# Patient Record
Sex: Female | Born: 1995 | Race: White | Hispanic: No | Marital: Single | Smoking: Former smoker
Health system: Southern US, Community
[De-identification: ages and names within clinical notes are randomized; demographics above are authoritative.]

## PROBLEM LIST (undated history)

## (undated) DIAGNOSIS — F32A Depression, unspecified: Secondary | ICD-10-CM

## (undated) DIAGNOSIS — F640 Transsexualism: Secondary | ICD-10-CM

## (undated) DIAGNOSIS — F4310 Post-traumatic stress disorder, unspecified: Secondary | ICD-10-CM

## (undated) DIAGNOSIS — G43909 Migraine, unspecified, not intractable, without status migrainosus: Secondary | ICD-10-CM

## (undated) DIAGNOSIS — F419 Anxiety disorder, unspecified: Secondary | ICD-10-CM

## (undated) DIAGNOSIS — R569 Unspecified convulsions: Secondary | ICD-10-CM

## (undated) DIAGNOSIS — F329 Major depressive disorder, single episode, unspecified: Secondary | ICD-10-CM

## (undated) DIAGNOSIS — Z789 Other specified health status: Secondary | ICD-10-CM

## (undated) HISTORY — DX: Depression, unspecified: F32.A

## (undated) HISTORY — DX: Anxiety disorder, unspecified: F41.9

## (undated) HISTORY — DX: Major depressive disorder, single episode, unspecified: F32.9

## (undated) HISTORY — PX: WISDOM TOOTH EXTRACTION: SHX21

## (undated) HISTORY — DX: Post-traumatic stress disorder, unspecified: F43.10

## (undated) HISTORY — DX: Migraine, unspecified, not intractable, without status migrainosus: G43.909

---

## 2016-11-13 ENCOUNTER — Encounter (HOSPITAL_COMMUNITY): Payer: Self-pay | Admitting: Emergency Medicine

## 2016-11-13 ENCOUNTER — Ambulatory Visit (HOSPITAL_COMMUNITY)
Admission: EM | Admit: 2016-11-13 | Discharge: 2016-11-13 | Disposition: A | Discharge status: Home / Self Care | Payer: Medicaid Other | Attending: Family Medicine | Admitting: Family Medicine

## 2016-11-13 DIAGNOSIS — Z975 Presence of (intrauterine) contraceptive device: Secondary | ICD-10-CM | POA: Insufficient documentation

## 2016-11-13 DIAGNOSIS — Z3202 Encounter for pregnancy test, result negative: Secondary | ICD-10-CM

## 2016-11-13 DIAGNOSIS — Z79899 Other long term (current) drug therapy: Secondary | ICD-10-CM | POA: Insufficient documentation

## 2016-11-13 DIAGNOSIS — Z87891 Personal history of nicotine dependence: Secondary | ICD-10-CM | POA: Insufficient documentation

## 2016-11-13 DIAGNOSIS — R109 Unspecified abdominal pain: Secondary | ICD-10-CM | POA: Diagnosis not present

## 2016-11-13 DIAGNOSIS — N390 Urinary tract infection, site not specified: Secondary | ICD-10-CM | POA: Diagnosis not present

## 2016-11-13 DIAGNOSIS — R112 Nausea with vomiting, unspecified: Secondary | ICD-10-CM | POA: Diagnosis not present

## 2016-11-13 DIAGNOSIS — R300 Dysuria: Secondary | ICD-10-CM | POA: Diagnosis not present

## 2016-11-13 LAB — POCT PREGNANCY, URINE: Preg Test, Ur: NEGATIVE

## 2016-11-13 LAB — POCT URINALYSIS DIP (DEVICE)
Bilirubin Urine: NEGATIVE
Glucose, UA: NEGATIVE mg/dL
Ketones, ur: NEGATIVE mg/dL
Nitrite: NEGATIVE
Protein, ur: 30 mg/dL — AB
Specific Gravity, Urine: 1.02 (ref 1.005–1.030)
Urobilinogen, UA: 0.2 mg/dL (ref 0.0–1.0)
pH: 7.5 (ref 5.0–8.0)

## 2016-11-13 MED ORDER — KETOROLAC TROMETHAMINE 10 MG PO TABS: 10.0000 mg | ORAL_TABLET | Freq: Four times a day (QID) | ORAL | 0 refills | Status: DC | PRN

## 2016-11-13 MED ORDER — SULFAMETHOXAZOLE-TRIMETHOPRIM 800-160 MG PO TABS: 1.0000 | ORAL_TABLET | Freq: Two times a day (BID) | ORAL | 0 refills | Status: AC

## 2016-11-13 NOTE — ED Triage Notes (Signed)
Pt reports burning with urination, vaginal bleeding and left flank pain since yesterday.  She states she had extreme flank pain this morning.  She took a hot shower and it eased it up some.  Vaginal bleeding started this morning as well, described more as mild spotting.

## 2016-11-13 NOTE — ED Provider Notes (Signed)
[facility]    CSN: [id number] Arrival date & time: 11/13/16  1216     History   Chief Complaint Chief Complaint  Patient presents with  . Urinary Tract Infection    HPI Shelia Walker is a 21 y.o. female.   Pt reports burning with urination, vaginal bleeding and left flank pain since yesterday.  She states she had extreme flank pain this morning.  She took a hot shower and it eased it up some.  Vaginal bleeding started this morning as well, described more as mild spotting.  Patient is undergoing transgender therapy.  Some nausea and vomiting.  Pain in left flank is subsiding .  She has had UTI's in past.  IUD in place      History reviewed. No pertinent past medical history.  There are no active problems to display for this patient.   Past Surgical History:  Procedure Laterality Date  . WISDOM TOOTH EXTRACTION      OB History    No data available       Home Medications    Prior to Admission medications   Medication Sig Start Date End Date Taking? Authorizing Provider  testosterone cypionate (DEPOTESTOTERONE CYPIONATE) 100 MG/ML injection Inject 100 mg into the muscle every 14 (fourteen) days. For IM use only   Yes [provider]  ketorolac (TORADOL) 10 MG tablet Take 1 tablet (10 mg total) by mouth every 6 (six) hours as needed. 11/13/16   Elvina Sidle, MD  sulfamethoxazole-trimethoprim (BACTRIM DS,SEPTRA DS) 800-160 MG tablet Take 1 tablet by mouth 2 (two) times daily. 11/13/16 11/20/16  Elvina Sidle, MD    Family History History reviewed. No pertinent family history.  Social History Social History  Substance Use Topics  . Smoking status: Former Games developer  . Smokeless tobacco: Never Used  . Alcohol use Yes     Allergies   Patient has no known allergies.   Review of Systems Review of Systems  Genitourinary: Positive for flank pain and vaginal bleeding.  All other systems reviewed and are negative.    Physical  Exam Triage Vital Signs ED Triage Vitals [11/13/16 1318]  Enc Vitals Group     BP      Pulse      Resp      Temp      Temp src      SpO2      Weight      Height      Head Circumference      Peak Flow      Pain Score 3     Pain Loc      Pain Edu?      Excl. in GC?    No data found.   Updated Vital Signs There were no vitals taken for this visit.     Physical Exam  Constitutional: [name], place, and time. She appears well-developed and well-nourished.  HENT:  Right Ear: External ear normal.  Left Ear: External ear normal.  Mouth/Throat: Oropharynx is clear and moist.  Eyes: Pupils are equal, round, and reactive to light. Conjunctivae and EOM are normal.  Neck: Normal range of motion. Neck supple.  Pulmonary/Chest: Effort normal.  Abdominal: There is tenderness.  Left lower quadrant tenderness along with some mild suprapubic tenderness.  No guarding or rebound  Musculoskeletal: Normal range of motion.  Neurological: She is alert and oriented to person, place, and time.  Skin: Skin is warm and dry.  Nursing note and  vitals reviewed.    UC Treatments / Results  Labs (all labs ordered are listed, but only abnormal results are displayed) Labs Reviewed  POCT URINALYSIS DIP (DEVICE) - Abnormal; Notable for the following:       Result Value   Hgb urine dipstick SMALL (*)    Protein, ur 30 (*)    Leukocytes, UA TRACE (*)    All other components within normal limits  URINE CULTURE  POCT PREGNANCY, URINE  URINE CYTOLOGY ANCILLARY ONLY    EKG  EKG Interpretation None       Radiology No results found.  Procedures Procedures (including critical care time)  Medications Ordered in UC Medications - No data to display   Initial Impression / Assessment and Plan / UC Course  I have reviewed the triage vital signs and the nursing notes.  Pertinent labs & imaging results that were available during my care of the patient were reviewed by me  and considered in my medical decision making (see chart for details).     Final Clinical Impressions(s) / UC Diagnoses   Final diagnoses:  Lower urinary tract infectious disease    New Prescriptions New Prescriptions   KETOROLAC (TORADOL) 10 MG TABLET    Take 1 tablet (10 mg total) by mouth every 6 (six) hours as needed.   SULFAMETHOXAZOLE-TRIMETHOPRIM (BACTRIM DS,SEPTRA DS) 800-160 MG TABLET    Take 1 tablet by mouth 2 (two) times daily.     Controlled Substance Prescriptions [place] Controlled Substance Registry consulted? Not Applicable   Elvina Sidle, MD 11/13/16 1336

## 2016-11-13 NOTE — Discharge Instructions (Signed)
You appear to have a urinary tract infection.  We are running confirmatory tests at this point.  Antibiotics and pain medicine ordered.

## 2016-11-15 LAB — URINE CULTURE: Culture: >=100000 — AB

## 2016-11-15 LAB — URINE CYTOLOGY ANCILLARY ONLY
Chlamydia: NEGATIVE
Neisseria Gonorrhea: NEGATIVE
Trichomonas: NEGATIVE

## 2017-08-12 ENCOUNTER — Ambulatory Visit (HOSPITAL_COMMUNITY)
Admission: EM | Admit: 2017-08-12 | Discharge: 2017-08-12 | Disposition: A | Discharge status: Home / Self Care | Payer: Medicaid Other | Attending: Family Medicine | Admitting: Family Medicine

## 2017-08-12 ENCOUNTER — Encounter (HOSPITAL_COMMUNITY): Payer: Self-pay | Admitting: Emergency Medicine

## 2017-08-12 DIAGNOSIS — R350 Frequency of micturition: Secondary | ICD-10-CM

## 2017-08-12 DIAGNOSIS — R109 Unspecified abdominal pain: Secondary | ICD-10-CM

## 2017-08-12 DIAGNOSIS — Z87891 Personal history of nicotine dependence: Secondary | ICD-10-CM | POA: Insufficient documentation

## 2017-08-12 DIAGNOSIS — N3001 Acute cystitis with hematuria: Secondary | ICD-10-CM | POA: Insufficient documentation

## 2017-08-12 DIAGNOSIS — R300 Dysuria: Secondary | ICD-10-CM

## 2017-08-12 HISTORY — DX: Transsexualism: F64.0

## 2017-08-12 HISTORY — DX: Other specified health status: Z78.9

## 2017-08-12 LAB — POCT URINALYSIS DIP (DEVICE)
Glucose, UA: 100 mg/dL — AB
Hgb urine dipstick: NEGATIVE
Ketones, ur: 15 mg/dL — AB
Nitrite: POSITIVE — AB
Protein, ur: >=300 mg/dL — AB
Specific Gravity, Urine: 1.015 (ref 1.005–1.030)
Urobilinogen, UA: >=8 mg/dL (ref 0.0–1.0)
pH: 5 (ref 5.0–8.0)

## 2017-08-12 MED ORDER — PHENAZOPYRIDINE HCL 200 MG PO TABS: 200.0000 mg | ORAL_TABLET | Freq: Three times a day (TID) | ORAL | 0 refills | Status: DC

## 2017-08-12 MED ORDER — SULFAMETHOXAZOLE-TRIMETHOPRIM 800-160 MG PO TABS: 1.0000 | ORAL_TABLET | Freq: Two times a day (BID) | ORAL | 0 refills | Status: AC

## 2017-08-12 NOTE — ED Provider Notes (Signed)
[facility]   SUBJECTIVE:  Shelia Walker is a 22 y.o. female, AKA Shelia Walker, complains of flank pain, urinary frequency, urgency and dysuria that began this morning.  Patient denies a precipitating event, recent sexual encounter, excessive caffeine intake, but states missed recent testosterone injection.  Localizes the pain to the right flank.  Pain is intermittent and describes it as sharp.  Has tried OTC UTI medications without relief.  Symptoms are made worse with urination.  Admits to similar symptoms in the past.   Complains of nausea, vomiting x3, and suprapubic discomfort.    Denies fever, chills, abnormal vaginal discharge or bleeding, hematuria.    LMP: No LMP recorded. (Menstrual status: IUD).  ROS: As in HPI.  Past Medical History:  Diagnosis Date  . Transgender    Past Surgical History:  Procedure Laterality Date  . WISDOM TOOTH EXTRACTION     Allergies  Allergen Reactions  . Ginger    No current facility-administered medications on file prior to encounter.    Current Outpatient Medications on File Prior to Encounter  Medication Sig Dispense Refill  . ketorolac (TORADOL) 10 MG tablet Take 1 tablet (10 mg total) by mouth every 6 (six) hours as needed. (Patient not taking: Reported on 08/12/2017) 10 tablet 0  . testosterone cypionate (DEPOTESTOTERONE CYPIONATE) 100 MG/ML injection Inject 100 mg into the muscle every 14 (fourteen) days. For IM use only     Social History   Socioeconomic History  . Marital status: Single    Spouse name: Not on file  . Number of children: Not on file  . Years of education: Not on file  . Highest education level: Not on file  Occupational History  . Not on file  Social Needs  . Financial resource strain: Not on file  . Food insecurity:    Worry: Not on file    Inability: Not on file  . Transportation needs:    Medical: Not on file    Non-medical: Not on file  Tobacco Use  . Smoking status: Former Games developer  . Smokeless  tobacco: Never Used  Substance and Sexual Activity  . Alcohol use: Yes  . Drug use: Yes    Types: Marijuana  . Sexual activity: Not on file  Lifestyle  . Physical activity:    Days per week: Not on file    Minutes per session: Not on file  . Stress: Not on file  Relationships  . Social connections:    Talks on phone: Not on file    Gets together: Not on file    Attends religious service: Not on file    Active member of club or organization: Not on file    Attends meetings of clubs or organizations: Not on file    Relationship status: Not on file  . Intimate partner violence:    Fear of current or ex partner: Not on file    Emotionally abused: Not on file    Physically abused: Not on file    Forced sexual activity: Not on file  Other Topics Concern  . Not on file  Social History Narrative  . Not on file   No family history on file.  OBJECTIVE:  Vitals:   08/12/17 1951  BP: (!) 143/94  Pulse: 92  Resp: 18  Temp: 98.2 F (36.8 C)  SpO2: 100%   General appearance: AOx3 in no acute distress HEENT: NCAT.  Oropharynx clear.  Lungs: clear to auscultation bilaterally without adventitious breath sounds Heart: regular  rate and rhythm.  Radial pulses 2+ symmetrical bilaterally Abdomen: soft; non-distended; mild diffuse tenderness about the lower abdomen; bowel sounds present; no masses or organomegaly; no guarding or rebound tenderness Back: RT sided CVA tenderness Extremities: no edema; symmetrical with no gross deformities Skin: warm and dry Neurologic: Ambulates from chair to exam table without difficulty Psychological: alert and cooperative; normal mood and affect   Results for orders placed or performed during the hospital encounter of 08/12/17 (from the past 24 hour(s))  POCT urinalysis dip (device)     Status: Abnormal   Collection Time: 08/12/17  7:59 PM  Result Value Ref Range   Glucose, UA 100 (A) NEGATIVE mg/dL   Bilirubin Urine MODERATE (A) NEGATIVE    Ketones, ur 15 (A) NEGATIVE mg/dL   Specific Gravity, Urine 1.015 1.005 - 1.030   Hgb urine dipstick NEGATIVE NEGATIVE   pH 5.0 5.0 - 8.0   Protein, ur >=300 (A) NEGATIVE mg/dL   Urobilinogen, UA >=4.0>=8.0 0.0 - 1.0 mg/dL   Nitrite POSITIVE (A) NEGATIVE   Leukocytes, UA LARGE (A) NEGATIVE     ASSESSMENT & PLAN:  1. Flank pain   2. Acute cystitis with hematuria     Meds ordered this encounter  Medications  . phenazopyridine (PYRIDIUM) 200 MG tablet    Sig: Take 1 tablet (200 mg total) by mouth 3 (three) times daily.    Dispense:  6 tablet    Refill:  0    Order Specific Question:   Supervising Provider    Answer:   Isa RankinMURRAY, LAURA WILSON [phone][[id number]]  . sulfamethoxazole-trimethoprim (BACTRIM DS,SEPTRA DS) 800-160 MG tablet    Sig: Take 1 tablet by mouth 2 (two) times daily for 10 days.    Dispense:  20 tablet    Refill:  0    Order Specific Question:   Supervising Provider    Answer:   Isa RankinMURRAY, LAURA WILSON [[id number]]    Urine culture sent.  We will call you with the results.   Push fluids and get plenty of rest.   Take antibiotic as directed and to completion Take pyridium as prescribed and as needed for symptomatic relief Follow up with PCP if symptoms persists Return here or go to ER if you have any new or worsening symptoms such as fever, worsening abdominal pain, nausea/vomiting, flank pain, etc...  Outlined signs and symptoms indicating need for more acute intervention. Patient verbalized understanding. After Visit Summary given.     Rennis HardingWurst, Ivi Griffith, PA-C 08/12/17 2031

## 2017-08-12 NOTE — ED Triage Notes (Signed)
Pt prefers to go by "TJ". Pt c/o R flank pain, states taking medication for bladder cramping.

## 2017-08-12 NOTE — Discharge Instructions (Addendum)
Urine culture sent.  We will call you with the results.   Push fluids and get plenty of rest.   Take antibiotic as directed and to completion Take pyridium as prescribed and as needed for symptomatic relief Follow up with PCP if symptoms persists Return here or go to ER if you have any new or worsening symptoms such as fever, worsening abdominal pain, nausea/vomiting, flank pain, etc... 

## 2017-08-14 LAB — URINE CULTURE

## 2017-10-24 DIAGNOSIS — H5213 Myopia, bilateral: Secondary | ICD-10-CM | POA: Diagnosis not present

## 2017-11-02 DIAGNOSIS — F640 Transsexualism: Secondary | ICD-10-CM | POA: Diagnosis not present

## 2017-11-04 ENCOUNTER — Ambulatory Visit (INDEPENDENT_AMBULATORY_CARE_PROVIDER_SITE_OTHER): Payer: BLUE CROSS/BLUE SHIELD | Admitting: Nurse Practitioner

## 2017-11-04 ENCOUNTER — Encounter: Payer: Self-pay | Admitting: Nurse Practitioner

## 2017-11-04 VITALS — BP 120/86 | HR 78 | Temp 98.1°F | Ht 63.0 in | Wt 163.0 lb

## 2017-11-04 DIAGNOSIS — M791 Myalgia, unspecified site: Secondary | ICD-10-CM | POA: Diagnosis not present

## 2017-11-04 DIAGNOSIS — R768 Other specified abnormal immunological findings in serum: Secondary | ICD-10-CM | POA: Diagnosis not present

## 2017-11-04 DIAGNOSIS — Z789 Other specified health status: Secondary | ICD-10-CM | POA: Insufficient documentation

## 2017-11-04 DIAGNOSIS — R739 Hyperglycemia, unspecified: Secondary | ICD-10-CM | POA: Diagnosis not present

## 2017-11-04 DIAGNOSIS — F332 Major depressive disorder, recurrent severe without psychotic features: Secondary | ICD-10-CM

## 2017-11-04 DIAGNOSIS — F640 Transsexualism: Secondary | ICD-10-CM | POA: Insufficient documentation

## 2017-11-04 LAB — COMPREHENSIVE METABOLIC PANEL
ALT: 48 U/L — ABNORMAL HIGH (ref 0–35)
AST: 28 U/L (ref 0–37)
Albumin: 4.9 g/dL (ref 3.5–5.2)
Alkaline Phosphatase: 72 U/L (ref 39–117)
BUN: 11 mg/dL (ref 6–23)
CO2: 31 mEq/L (ref 19–32)
Calcium: 10 mg/dL (ref 8.4–10.5)
Chloride: 102 mEq/L (ref 96–112)
Creatinine, Ser: 0.91 mg/dL (ref 0.40–1.20)
GFR: 82.49 mL/min (ref >60.00)
Glucose, Bld: 102 mg/dL — ABNORMAL HIGH (ref 70–99)
Potassium: 5 mEq/L (ref 3.5–5.1)
Sodium: 141 mEq/L (ref 135–145)
Total Bilirubin: 0.6 mg/dL (ref 0.2–1.2)
Total Protein: 8.2 g/dL (ref 6.0–8.3)

## 2017-11-04 LAB — CBC
HCT: 48.5 % — ABNORMAL HIGH (ref 36.0–46.0)
Hemoglobin: 16.2 g/dL — ABNORMAL HIGH (ref 12.0–15.0)
MCHC: 33.4 g/dL (ref 30.0–36.0)
MCV: 83.5 fl (ref 78.0–100.0)
Platelets: 396 10*3/uL (ref 150.0–400.0)
RBC: 5.81 Mil/uL — ABNORMAL HIGH (ref 3.87–5.11)
RDW: 13.8 % (ref 11.5–15.5)
WBC: 6.1 10*3/uL (ref 4.0–10.5)

## 2017-11-04 LAB — SEDIMENTATION RATE: Sed Rate: 32 mm/hr — ABNORMAL HIGH (ref 0–20)

## 2017-11-04 LAB — TSH: TSH: 1.8 u[IU]/mL (ref 0.35–4.50)

## 2017-11-04 LAB — HEMOGLOBIN A1C: Hgb A1c MFr Bld: 5.7 % (ref 4.6–6.5)

## 2017-11-04 MED ORDER — FLUOXETINE HCL 20 MG PO TABS: 20.0000 mg | ORAL_TABLET | Freq: Every day | ORAL | 0 refills | Status: DC
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
## 2017-11-04 NOTE — Patient Instructions (Addendum)
Please sign medical release to get records from Dr. Bascom Levels.  positive ANA and mild elevation in sed rate, this means symptoms may be related to an autoimmune disorder. I entered referral to rheumatology. CMP, TSH and hgbA1c are stable. CBC indicates elevated hgb and hct due to current use of testosterone injections. Need to compare these results to previous reports from Dr.Frazier. Waiting for records.  Start prozac.  Major Depressive Disorder, Adult Major depressive disorder (MDD) is a mental health condition. MDD often makes you feel sad, hopeless, or helpless. MDD can also cause symptoms in your body. MDD can affect your:  Work.  School.  Relationships.  Other normal activities.  MDD can range from mild to very bad. It may occur once (single episode MDD). It can also occur many times (recurrent MDD). The main symptoms of MDD often include:  Feeling sad, depressed, or irritable most of the time.  Loss of interest.  MDD symptoms also include:  Sleeping too much or too little.  Eating too much or too little.  A change in your weight.  Feeling tired (fatigue) or having low energy.  Feeling worthless.  Feeling guilty.  Trouble making decisions.  Trouble thinking clearly.  Thoughts of suicide or harming others.  Feeling weak.  Feeling agitated.  Keeping yourself from being around other people (isolation).  Follow these instructions at home: Activity  Do these things as told by your doctor: ? Go back to your normal activities. ? Exercise regularly. ? Spend time outdoors. Alcohol  Talk with your doctor about how alcohol can affect your antidepressant medicines.  Do not drink alcohol. Or, limit how much alcohol you drink. ? This means no more than 1 drink a day for nonpregnant women and 2 drinks a day for men. One drink equals one of these:  12 oz of beer.  5 oz of wine.  1 oz of hard liquor. General instructions  Take over-the-counter and  prescription medicines only as told by your doctor.  Eat a healthy diet.  Get plenty of sleep.  Find activities that you enjoy. Make time to do them.  Think about joining a support group. Your doctor may be able to suggest a group for you.  Keep all follow-up visits as told by your doctor. This is important. Where to find more information:  [employer] on Mental Illness: ? www.nami.org  [place] [employer] of Mental Health: ? http://www.maynard.net/  National Suicide Prevention Lifeline: ? 202-510-5806. This is free, 24-hour help. Contact a doctor if:  Your symptoms get worse.  You have new symptoms. Get help right away if:  You self-harm.  You see, hear, taste, smell, or feel things that are not present (hallucinate). If you ever feel like you may hurt yourself or others, or have thoughts about taking your own life, get help right away. You can go to your nearest emergency department or call:  Your local emergency services (911 in the [place]  A suicide crisis helpline, such as the National Suicide Prevention Lifeline: ? (984)222-3764. This is open 24 hours a day.  This information is not intended to replace advice given to you by your health care provider. Make sure you discuss any questions you have with your health care provider. Document Released: 01/27/2015 Document Revised: 11/02/2015 Document Reviewed: 11/02/2015 Elsevier Interactive Patient Education  2017 [employer].

## 2017-11-04 NOTE — Assessment & Plan Note (Addendum)
Use of testosterone injection since 06/2016, prescribed [facility] with [facility]. OV every 96months per patient. No change in mood with hormone replacement therapy
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
## 2017-11-04 NOTE — Assessment & Plan Note (Signed)
I think this is related to depression. Ordered CBC, TSH, sed rate and ANA to make sure there is no secondary cause.

## 2017-11-06 LAB — ANA: Anti Nuclear Antibody(ANA): POSITIVE — AB

## 2017-11-06 LAB — ANTI-NUCLEAR AB-TITER (ANA TITER): ANA Titer 1: 1:640 {titer} — ABNORMAL HIGH

## 2017-11-07 DIAGNOSIS — R7689 Other specified abnormal immunological findings in serum: Secondary | ICD-10-CM | POA: Insufficient documentation

## 2017-11-07 DIAGNOSIS — R768 Other specified abnormal immunological findings in serum: Secondary | ICD-10-CM | POA: Insufficient documentation

## 2017-11-07 NOTE — Addendum Note (Signed)
Addended by: [name] L on: 11/07/2017 01:17 PM   Modules accepted: Orders

## 2017-11-11 DIAGNOSIS — F641 Gender identity disorder in adolescence and adulthood: Secondary | ICD-10-CM | POA: Diagnosis not present

## 2017-11-11 DIAGNOSIS — F331 Major depressive disorder, recurrent, moderate: Secondary | ICD-10-CM | POA: Diagnosis not present

## 2017-11-11 NOTE — Progress Notes (Addendum)
Office Visit Note  Patient: Shelia Walker             Date of Birth: Oct 17, 1995           MRN: [id number]             PCP: Anne Ng, NP Referring: Anne Ng, NP Visit Date: 11/21/2017 Occupation: [employer] 3rd year  Subjective:  Pain in muscles and fatigue.   History of Present Illness: Shelia Walker is a 22 y.o. transgender female who has been seen in consultation per request of PCP for evaluation of muscle pain and fatigue.  According to Shelia Walker the discomfort is started in February 2019 with increased muscle pain which could be really sharp or dull.  There is no history of joint pain.  No history of swelling or rash.  There is positive history of arthritis in mother and grandmother.  Shelia Walker also experiences insomnia and sleeps about 5 to 6 hours at night.  Activities of Daily Living:  Patient reports morning stiffness for 5-10 minutes.   Patient Reports nocturnal pain.  Difficulty dressing/grooming: Reports Difficulty climbing stairs: Reports Difficulty getting out of chair: Reports Difficulty using hands for taps, buttons, cutlery, and/or writing: Denies  Review of Systems  Constitutional: Positive for fatigue. Negative for night sweats, weight gain and weight loss.  HENT: Positive for mouth dryness. Negative for mouth sores, trouble swallowing, trouble swallowing and nose dryness.   Eyes: Negative for pain, redness, visual disturbance and dryness.  Respiratory: Negative for cough, shortness of breath and difficulty breathing.        With activity   Cardiovascular: Negative for chest pain, palpitations, hypertension, irregular heartbeat and swelling in legs/feet.  Gastrointestinal: Negative for abdominal pain, blood in stool, constipation, diarrhea, nausea and vomiting.  Endocrine: Negative for increased urination.  Genitourinary: Negative for nocturia, pelvic pain and vaginal dryness.  Musculoskeletal: Positive for arthralgias, joint pain, myalgias, morning stiffness,  muscle tenderness and myalgias. Negative for joint swelling and muscle weakness.  Skin: Negative for color change, rash, hair loss, skin tightness, ulcers and sensitivity to sunlight.  Allergic/Immunologic: Negative for susceptible to infections.  Neurological: Negative for dizziness, light-headedness, numbness, headaches, memory loss, night sweats and weakness.  Hematological: Negative for bruising/bleeding tendency and swollen glands.  Psychiatric/Behavioral: Positive for depressed mood and sleep disturbance. Negative for confusion. The patient is nervous/anxious.     PMFS History:  Patient Active Problem List   Diagnosis Date Noted  . Positive ANA (antinuclear antibody) 11/07/2017  . 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 Severe episode of recurrent major depressive disorder, without psychotic features (HCC) 11/04/2017  . Myalgia 11/04/2017    Past Medical History:  Diagnosis Date  . Anxiety   . Depression   . Migraines   . PTSD (post-traumatic stress disorder)   . Transgender     Family History  Problem Relation Age of Onset  . Fibromyalgia Mother   . Cerebral aneurysm Mother   . Hyperlipidemia Mother   . Hypertension Mother   . Stroke Mother   . Depression Mother   . Autoimmune disease Mother   . Depression Father   . Fibromyalgia Maternal Grandmother   . Arthritis Maternal Grandmother   . Diabetes Maternal Grandmother   . Depression Maternal Grandmother   . Depression Sister   . Learning disabilities Sister   . Mental illness Sister   . Healthy Brother    Past Surgical History:  Procedure Laterality Date  . WISDOM TOOTH EXTRACTION  Social History   Social History Narrative  . Not on file    Objective: Vital Signs: BP 126/83 (BP Location: Right Arm, Patient Position: Sitting, Cuff Size: Normal)   Pulse 79   Resp 16   Ht 5\' 3"  (1.6 m)   Wt 161 lb (73 kg)   BMI 28.52 kg/m    Physical Exam  Constitutional: [name], place,  and time. She appears well-developed and well-nourished.  HENT:  Head: Normocephalic and atraumatic.  Eyes: Conjunctivae and EOM are normal.  Neck: Normal range of motion.  Cardiovascular: Normal rate, regular rhythm, normal heart sounds and intact distal pulses.  Pulmonary/Chest: Effort normal and breath sounds normal.  Abdominal: Soft. Bowel sounds are normal.  Lymphadenopathy:    She has no cervical adenopathy.  Neurological: She is alert and oriented to person, place, and time.  Skin: Skin is warm and dry. Capillary refill takes less than 2 seconds.  Psychiatric: She has a normal mood and affect. Her behavior is normal.  Nursing note and vitals reviewed.    Musculoskeletal Exam: C-spine thoracic lumbar spine good range of motion.  Shoulder joints elbow joints wrist joint MCPs PIPs DIPs been good range of motion with no synovitis.  Hip joints knee joints ankles MTPs PIPs were in good range of motion.  Patient has not of discomfort range of motion of bilateral knee joints.  There was tenderness on palpation of her trapezius area and bilateral trochanteric area.  Mild hypermobility and hyperalgesia was noted.  CDAI Exam: CDAI Score: Not documented Patient Global Assessment: Not documented; Provider Global Assessment: Not documented Swollen: Not documented; Tender: Not documented Joint Exam   Not documented   There is currently no information documented on the homunculus. Go to the Rheumatology activity and complete the homunculus joint exam.  Investigation: No additional findings. Component     Latest Ref Rng & Units 11/04/2017  ANA Pattern 1      SPECKLED (A)  ANA Titer 1     titer 1:640 (H)  Sed Rate     0 - 20 mm/hr 32 (H)  Anti Nuclear Antibody(ANA)     NEGATIVE POSITIVE (A)   Imaging: No results found.  Recent Labs: Lab Results  Component Value Date   WBC 6.1 11/04/2017   HGB 16.2 (H) 11/04/2017   PLT 396.0 11/04/2017   NA 141 11/04/2017   K 5.0 11/04/2017   CL  102 11/04/2017   CO2 31 11/04/2017   GLUCOSE 102 (H) 11/04/2017   BUN 11 11/04/2017   CREATININE 0.91 11/04/2017   BILITOT 0.6 11/04/2017   ALKPHOS 72 11/04/2017   AST 28 11/04/2017   ALT 48 (H) 11/04/2017   PROT 8.2 11/04/2017   ALBUMIN 4.9 11/04/2017   CALCIUM 10.0 11/04/2017    Speciality Comments: No specialty comments available.  Procedures:  No procedures performed Allergies: Ginger and Pyridium [phenazopyridine hcl]   Assessment / Plan:     Visit Diagnoses: Positive ANA (antinuclear antibody) - 11/04/17: ANA 1:640 speckled, sed rate 32, TSH 1.80.  Patient has no clinical features of autoimmune disease on examination although ANA titer is significant.  I will obtain AVISE labs today.  Myalgia-patient has been experiencing myalgias.  I will obtain CK today.  Chronic pain of both knees -patient gives history of chronic discomfort in the knee joints.  Plan: XR KNEE 3 VIEW RIGHT, XR KNEE 3 VIEW LEFT, the x-ray of both knees were unremarkable.  I have given  a handout  on knee joint exercises.  Rheumatoid factor, Cyclic citrul peptide antibody, IgG  Primary insomnia-patient has been experiencing chronic insomnia for many years.  Patient has been prescribed medication for insomnia.  Myofascial pain-I suspect that patient has myofascial pain syndrome due to generalized hyperalgesia mild hypermobility and ongoing fatigue and discomfort.  Need for regular exercise good sleep hygiene was discussed.  Female-to-female transgender person  Anxiety and depression-patient states that the depression is better controlled now.  History of posttraumatic stress disorder (PTSD)  Hx of migraines  Other fatigue - Plan: COMPLETE METABOLIC PANEL WITH GFR, Sedimentation rate, CK, Serum protein electrophoresis with reflex, Glucose 6 phosphate dehydrogenase   Orders: Orders Placed This Encounter  Procedures  . XR KNEE 3 VIEW RIGHT  . XR KNEE 3 VIEW LEFT  . COMPLETE METABOLIC PANEL WITH GFR  .  Sedimentation rate  . CK  . Rheumatoid factor  . Cyclic citrul peptide antibody, IgG  . Serum protein electrophoresis with reflex  . Glucose 6 phosphate dehydrogenase   No orders of the defined types were placed in this encounter.   Face-to-face time spent with patient was 50 minutes. Greater than 50% of time was spent in counseling and coordination of care.  Follow-Up Instructions: Return for Positive ANA.   Pollyann SavoyShaili Deveshwar, MD  Note - This record has been created using Animal [employer] software.  Chart creation errors have been sought, but may not always  have been located. Such creation errors do not reflect on  the standard of medical care.

## 2017-11-16 DIAGNOSIS — F640 Transsexualism: Secondary | ICD-10-CM | POA: Diagnosis not present

## 2017-11-18 ENCOUNTER — Ambulatory Visit (INDEPENDENT_AMBULATORY_CARE_PROVIDER_SITE_OTHER): Payer: BLUE CROSS/BLUE SHIELD | Admitting: Nurse Practitioner

## 2017-11-18 ENCOUNTER — Encounter: Payer: Self-pay | Admitting: Nurse Practitioner

## 2017-11-18 VITALS — BP 122/80 | HR 87 | Temp 98.5°F | Ht 63.0 in | Wt 160.8 lb

## 2017-11-18 DIAGNOSIS — Z23 Encounter for immunization: Secondary | ICD-10-CM

## 2017-11-18 DIAGNOSIS — G4700 Insomnia, unspecified: Secondary | ICD-10-CM | POA: Diagnosis not present

## 2017-11-18 DIAGNOSIS — F332 Major depressive disorder, recurrent severe without psychotic features: Secondary | ICD-10-CM | POA: Diagnosis not present

## 2017-11-18 MED ORDER — FLUOXETINE HCL 20 MG PO TABS: 20.0000 mg | ORAL_TABLET | Freq: Every day | ORAL | 1 refills | Status: DC

## 2017-11-18 MED ORDER — TRAZODONE HCL 50 MG PO TABS: 25.0000 mg | ORAL_TABLET | Freq: Every evening | ORAL | 1 refills | Status: DC | PRN

## 2017-11-18 NOTE — Patient Instructions (Signed)
[facility]. [address], [place], [place] [address]  Phone: [phone].  [facility] [address], [place], [place] [address]  Phone: [phone]   Trazodone tablets What is this medicine? TRAZODONE (TRAZ oh done) is used to treat depression. This medicine may be used for other purposes; ask your health care provider or pharmacist if you have questions. COMMON BRAND NAME(S): Desyrel What should I tell my health care provider before I take this medicine? They need to know if you have any of these conditions: -attempted suicide or thinking about it -bipolar disorder -bleeding problems -glaucoma -heart disease, or previous heart attack -irregular heart beat -kidney or liver disease -low levels of sodium in the blood -an unusual or allergic reaction to trazodone, other medicines, foods, dyes or preservatives -pregnant or trying to get pregnant -breast-feeding How should I use this medicine? Take this medicine by mouth with a glass of water. Follow the directions on the prescription label. Take this medicine shortly after a meal or a light snack. Take your medicine at regular intervals. Do not take your medicine more often than directed. Do not stop taking this medicine suddenly except upon the advice of your doctor. Stopping this medicine too quickly may cause serious side effects or your condition may worsen. A special MedGuide will be given to you by the pharmacist with each prescription and refill. Be sure to read this information carefully each time. Talk to your pediatrician regarding the use of this medicine in children. Special care may be needed. Overdosage: If you think you have taken too much of this medicine contact a poison control center or emergency room at once. NOTE: This medicine is only for you. Do not share this medicine with others. What if I miss a dose? If you miss a dose, take it as soon as you can. If  it is almost time for your next dose, take only that dose. Do not take double or extra doses. What may interact with this medicine? Do not take this medicine with any of the following medications: -certain medicines for fungal infections like fluconazole, itraconazole, ketoconazole, posaconazole, voriconazole -cisapride -dofetilide -dronedarone -linezolid -MAOIs like Carbex, Eldepryl, Marplan, Nardil, and Parnate -mesoridazine -methylene blue (injected into a vein) -pimozide -saquinavir -thioridazine -ziprasidone This medicine may also interact with the following medications: -alcohol -antiviral medicines for HIV or AIDS -aspirin and aspirin-like medicines -barbiturates like phenobarbital -certain medicines for blood pressure, heart disease, irregular heart beat -certain medicines for depression, anxiety, or psychotic disturbances -certain medicines for migraine headache like almotriptan, eletriptan, frovatriptan, naratriptan, rizatriptan, sumatriptan, zolmitriptan -certain medicines for seizures like carbamazepine and phenytoin -certain medicines for sleep -certain medicines that treat or prevent blood clots like dalteparin, enoxaparin, warfarin -digoxin -fentanyl -lithium -NSAIDS, medicines for pain and inflammation, like ibuprofen or naproxen -other medicines that prolong the QT interval (cause an abnormal heart rhythm) -rasagiline -supplements like St. John's wort, kava kava, valerian -tramadol -tryptophan This list may not describe all possible interactions. Give your health care provider a list of all the medicines, herbs, non-prescription drugs, or dietary supplements you use. Also tell them if you smoke, drink alcohol, or use illegal drugs. Some items may interact with your medicine. What should I watch for while using this medicine? Tell your doctor if your symptoms do not get better or if they get worse. Visit your doctor or health care professional for regular checks  on your progress. Because it may take several weeks to see the full  effects of this medicine, it is important to continue your treatment as prescribed by your doctor. Patients and their families should watch out for new or worsening thoughts of suicide or depression. Also watch out for sudden changes in feelings such as feeling anxious, agitated, panicky, irritable, hostile, aggressive, impulsive, severely restless, overly excited and hyperactive, or not being able to sleep. If this happens, especially at the beginning of treatment or after a change in dose, call your health care professional. Bonita Quin may get drowsy or dizzy. Do not drive, use machinery, or do anything that needs mental alertness until you know how this medicine affects you. Do not stand or sit up quickly, especially if you are an older patient. This reduces the risk of dizzy or fainting spells. Alcohol may interfere with the effect of this medicine. Avoid alcoholic drinks. This medicine may cause dry eyes and blurred vision. If you wear contact lenses you may feel some discomfort. Lubricating drops may help. See your eye doctor if the problem does not go away or is severe. Your mouth may get dry. Chewing sugarless gum, sucking hard candy and drinking plenty of water may help. Contact your doctor if the problem does not go away or is severe. What side effects may I notice from receiving this medicine? Side effects that you should report to your doctor or health care professional as soon as possible: -allergic reactions like skin rash, itching or hives, swelling of the face, lips, or tongue -elevated mood, decreased need for sleep, racing thoughts, impulsive behavior -confusion -fast, irregular heartbeat -feeling faint or lightheaded, falls -feeling agitated, angry, or irritable -loss of balance or coordination -painful or prolonged erections -restlessness, pacing, inability to keep still -suicidal thoughts or other mood  changes -tremors -trouble sleeping -seizures -unusual bleeding or bruising Side effects that usually do not require medical attention (report to your doctor or health care professional if they continue or are bothersome): -change in sex drive or performance -change in appetite or weight -constipation -headache -muscle aches or pains -nausea This list may not describe all possible side effects. Call your doctor for medical advice about side effects. You may report side effects to FDA at 1-800-FDA-1088. Where should I keep my medicine? Keep out of the reach of children. Store at room temperature between 15 and 30 degrees C (59 to 86 degrees F). Protect from light. Keep container tightly closed. Throw away any unused medicine after the expiration date. NOTE: This sheet is a summary. It may not cover all possible information. If you have questions about this medicine, talk to your doctor, pharmacist, or health care provider.  2018 Elsevier/Gold Standard (2015-07-17 16:57:05)  Insomnia Insomnia is a sleep disorder that makes it difficult to fall asleep or to stay asleep. Insomnia can cause tiredness (fatigue), low energy, difficulty concentrating, mood swings, and poor performance at work or school. There are three different ways to classify insomnia:  Difficulty falling asleep.  Difficulty staying asleep.  Waking up too early in the morning.  Any type of insomnia can be long-term (chronic) or short-term (acute). Both are common. Short-term insomnia usually lasts for three months or less. Chronic insomnia occurs at least three times a week for longer than three months. What are the causes? Insomnia may be caused by another condition, situation, or substance, such as:  Anxiety.  Certain medicines.  Gastroesophageal reflux disease (GERD) or other gastrointestinal conditions.  Asthma or other breathing conditions.  Restless legs syndrome, sleep apnea, or other sleep  disorders.  Chronic pain.  Menopause. This may include hot flashes.  Stroke.  Abuse of alcohol, tobacco, or illegal drugs.  Depression.  Caffeine.  Neurological disorders, such as Alzheimer disease.  An overactive thyroid (hyperthyroidism).  The cause of insomnia may not be known. What increases the risk? Risk factors for insomnia include:  Gender. Women are more commonly affected than men.  Age. Insomnia is more common as you get older.  Stress. This may involve your professional or personal life.  Income. Insomnia is more common in people with lower income.  Lack of exercise.  Irregular work schedule or night shifts.  Traveling between different time zones.  What are the signs or symptoms? If you have insomnia, trouble falling asleep or trouble staying asleep is the main symptom. This may lead to other symptoms, such as:  Feeling fatigued.  Feeling nervous about going to sleep.  Not feeling rested in the morning.  Having trouble concentrating.  Feeling irritable, anxious, or depressed.  How is this treated? Treatment for insomnia depends on the cause. If your insomnia is caused by an underlying condition, treatment will focus on addressing the condition. Treatment may also include:  Medicines to help you sleep.  Counseling or therapy.  Lifestyle adjustments.  Follow these instructions at home:  Take medicines only as directed by your health care provider.  Keep regular sleeping and waking hours. Avoid naps.  Keep a sleep diary to help you and your health care provider figure out what could be causing your insomnia. Include: ? When you sleep. ? When you wake up during the night. ? How well you sleep. ? How rested you feel the next day. ? Any side effects of medicines you are taking. ? What you eat and drink.  Make your bedroom a comfortable place where it is easy to fall asleep: ? Put up shades or special blackout curtains to block light from  outside. ? Use a white noise machine to block noise. ? Keep the temperature cool.  Exercise regularly as directed by your health care provider. Avoid exercising right before bedtime.  Use relaxation techniques to manage stress. Ask your health care provider to suggest some techniques that may work well for you. These may include: ? Breathing exercises. ? Routines to release muscle tension. ? Visualizing peaceful scenes.  Cut back on alcohol, caffeinated beverages, and cigarettes, especially close to bedtime. These can disrupt your sleep.  Do not overeat or eat spicy foods right before bedtime. This can lead to digestive discomfort that can make it hard for you to sleep.  Limit screen use before bedtime. This includes: ? Watching TV. ? Using your smartphone, tablet, and computer.  Stick to a routine. This can help you fall asleep faster. Try to do a quiet activity, brush your teeth, and go to bed at the same time each night.  Get out of bed if you are still awake after 15 minutes of trying to sleep. Keep the lights down, but try reading or doing a quiet activity. When you feel sleepy, go back to bed.  Make sure that you drive carefully. Avoid driving if you feel very sleepy.  Keep all follow-up appointments as directed by your health care provider. This is important. Contact a health care provider if:  You are tired throughout the day or have trouble in your daily routine due to sleepiness.  You continue to have sleep problems or your sleep problems get worse. Get help right away if:  You have serious thoughts  about hurting yourself or someone else. This information is not intended to replace advice given to you by your health care provider. Make sure you discuss any questions you have with your health care provider. Document Released: 02/13/2000 Document Revised: 07/18/2015 Document Reviewed: 11/16/2013 Elsevier Interactive Patient Education  [employer].

## 2017-11-18 NOTE — Progress Notes (Signed)
Subjective:  Patient ID: Shelia PlaterVictoria Kestenbaum, female    DOB: 06/04/1995  Age: 22 y.o. MRN: [id number]  CC: Follow-up (2 wk follow up/ had headache for the first week of start med. insomnia consult--was taking melatonin and hydrozyzine--not helping. tdap)  Insomnia  Primary symptoms: fragmented sleep, no sleep disturbance, no difficulty falling asleep, no somnolence, frequent awakening, no premature morning awakening, no malaise/fatigue, no napping.   The current episode started more than one year. The onset quality is gradual. The problem occurs nightly. The problem is unchanged. The symptoms are aggravated by anxiety, caffeine, pain and SSRI use. How many beverages per day that contain caffeine: 0 - 1.  Types of beverages you drink: coffee and soda. Past treatments include medication (melatonin and vistaril). The treatment provided no relief. Typical bedtime:  8-10 P.M..  How long after going to bed to you fall asleep: 15-30 minutes.   PMH includes: associated symptoms present, no hypertension, depression, family stress or anxiety, no restless leg syndrome, no work related stressors, chronic pain, no apnea.  Prior diagnostic workup includes:  Blood work.   Positive ANA: appointment with rheumatology Monday 11/21/2017.  Depression: Improved mood. No adverse effects with prozac. Resolved headache. No SI or HI. No self mutilation Maintains appts with psychologist weekly. [facility], will like another recommendation.  Insomnia: Related with mood disorder. Can not stay asleep. Wakes up every 1-2hrs. Used melatonin 30mg   In past with no relief Vistaril may is worst.  Reviewed past Medical, Social and Family history today.  Outpatient Medications Prior to Visit  Medication Sig Dispense Refill  . B-D 3CC LUER-LOK SYR 25GX1/2" 25G X 1-1/2" 3 ML MISC INJECT 1 ML Q OTHER WEEK INTO MUSCLE UTD  1  . BD DISP NEEDLES 22G X 1-1/2" MISC USE UTD  1  . testosterone  cypionate (DEPOTESTOSTERONE CYPIONATE) 200 MG/ML injection INJECT 1 ML INTO THE MUSCLE Q 2 WEEKS  0  . FLUoxetine (PROZAC) 20 MG tablet Take 1 tablet (20 mg total) by mouth daily. 30 tablet 0  . phenazopyridine (PYRIDIUM) 200 MG tablet Take 1 tablet (200 mg total) by mouth 3 (three) times daily. (Patient not taking: Reported on 11/04/2017) 6 tablet 0  . testosterone cypionate (DEPOTESTOTERONE CYPIONATE) 100 MG/ML injection Inject 100 mg into the muscle every 14 (fourteen) days. For IM use only     No facility-administered medications prior to visit.     ROS See HPI  Objective:  BP 122/80   Pulse 87   Temp 98.5 F (36.9 C) (Oral)   Ht 5\' 3"  (1.6 m)   Wt 160 lb 12.8 oz (72.9 kg)   SpO2 97%   BMI 28.48 kg/m   BP Readings from Last 3 Encounters:  11/18/17 122/80  11/04/17 120/86  08/12/17 (!) 143/94    Wt Readings from Last 3 Encounters:  11/18/17 160 lb 12.8 oz (72.9 kg)  11/04/17 163 lb (73.9 kg)    Physical Exam  Constitutional: She appears well-developed and well-nourished.  Cardiovascular: Normal rate and regular rhythm.  Psychiatric: She has a normal mood and affect. [name]. Thought content is not delusional. Cognition and memory are normal. She expresses no suicidal ideation. She expresses no suicidal plans and no homicidal plans.  Vitals reviewed.   Lab Results  Component Value Date   WBC 6.1 11/04/2017   HGB 16.2 (H) 11/04/2017   HCT 48.5 (H) 11/04/2017   PLT 396.0 11/04/2017   GLUCOSE  102 (H) 11/04/2017   ALT 48 (H) 11/04/2017   AST 28 11/04/2017   NA 141 11/04/2017   K 5.0 11/04/2017   CL 102 11/04/2017   CREATININE 0.91 11/04/2017   BUN 11 11/04/2017   CO2 31 11/04/2017   TSH 1.80 11/04/2017   HGBA1C 5.7 11/04/2017    Assessment & Plan:   Milo was seen today for follow-up.  Diagnoses and all orders for this visit:  Severe episode of recurrent major depressive disorder, without psychotic features (HCC) -      FLUoxetine (PROZAC) 20 MG tablet; Take 1 tablet (20 mg total) by mouth daily.  Insomnia, unspecified type -     traZODone (DESYREL) 50 MG tablet; Take 0.5-1 tablets (25-50 mg total) by mouth at bedtime as needed for sleep.  Need for diphtheria-tetanus-pertussis (Tdap) vaccine -     Tdap vaccine greater than or equal to 7yo IM   I am having [name] Friedl "TJ" start on traZODone. I am also having her maintain her testosterone cypionate, phenazopyridine, BD DISP NEEDLES, B-D 3CC LUER-LOK SYR 25GX1/2", testosterone cypionate, and FLUoxetine.  Meds ordered this encounter  Medications  . traZODone (DESYREL) 50 MG tablet    Sig: Take 0.5-1 tablets (25-50 mg total) by mouth at bedtime as needed for sleep.    Dispense:  30 tablet    Refill:  1    Order Specific Question:   Supervising Provider    Answer:   MATTHEWS, CODY [[id number]]  . FLUoxetine (PROZAC) 20 MG tablet    Sig: Take 1 tablet (20 mg total) by mouth daily.    Dispense:  90 tablet    Refill:  1    Order Specific Question:   Supervising Provider    Answer:   MATTHEWS, CODY [[id number]]    Follow-up: Return in about 4 weeks (around 12/16/2017) for insomnia and depression.  [name], NP

## 2017-11-21 ENCOUNTER — Encounter: Payer: Self-pay | Admitting: Rheumatology

## 2017-11-21 ENCOUNTER — Ambulatory Visit (INDEPENDENT_AMBULATORY_CARE_PROVIDER_SITE_OTHER): Payer: Self-pay

## 2017-11-21 ENCOUNTER — Encounter: Payer: Self-pay | Admitting: Nurse Practitioner

## 2017-11-21 ENCOUNTER — Ambulatory Visit: Payer: BLUE CROSS/BLUE SHIELD | Admitting: Rheumatology

## 2017-11-21 VITALS — BP 126/83 | HR 79 | Resp 16 | Ht 63.0 in | Wt 161.0 lb

## 2017-11-21 DIAGNOSIS — R768 Other specified abnormal immunological findings in serum: Secondary | ICD-10-CM | POA: Diagnosis not present

## 2017-11-21 DIAGNOSIS — F640 Transsexualism: Secondary | ICD-10-CM

## 2017-11-21 DIAGNOSIS — M7918 Myalgia, other site: Secondary | ICD-10-CM

## 2017-11-21 DIAGNOSIS — F5101 Primary insomnia: Secondary | ICD-10-CM | POA: Diagnosis not present

## 2017-11-21 DIAGNOSIS — Z789 Other specified health status: Secondary | ICD-10-CM

## 2017-11-21 DIAGNOSIS — F32A Depression, unspecified: Secondary | ICD-10-CM

## 2017-11-21 DIAGNOSIS — F419 Anxiety disorder, unspecified: Secondary | ICD-10-CM

## 2017-11-21 DIAGNOSIS — M25561 Pain in right knee: Secondary | ICD-10-CM

## 2017-11-21 DIAGNOSIS — M25562 Pain in left knee: Secondary | ICD-10-CM

## 2017-11-21 DIAGNOSIS — M791 Myalgia, unspecified site: Secondary | ICD-10-CM

## 2017-11-21 DIAGNOSIS — G8929 Other chronic pain: Secondary | ICD-10-CM

## 2017-11-21 DIAGNOSIS — F329 Major depressive disorder, single episode, unspecified: Secondary | ICD-10-CM

## 2017-11-21 DIAGNOSIS — R5383 Other fatigue: Secondary | ICD-10-CM | POA: Diagnosis not present

## 2017-11-21 DIAGNOSIS — Z8669 Personal history of other diseases of the nervous system and sense organs: Secondary | ICD-10-CM

## 2017-11-21 DIAGNOSIS — Z8659 Personal history of other mental and behavioral disorders: Secondary | ICD-10-CM

## 2017-11-21 NOTE — Patient Instructions (Signed)

## 2017-11-22 ENCOUNTER — Other Ambulatory Visit: Payer: Self-pay | Admitting: *Deleted

## 2017-11-22 ENCOUNTER — Telehealth: Payer: Self-pay | Admitting: *Deleted

## 2017-11-22 DIAGNOSIS — M791 Myalgia, unspecified site: Secondary | ICD-10-CM | POA: Diagnosis not present

## 2017-11-22 DIAGNOSIS — R768 Other specified abnormal immunological findings in serum: Secondary | ICD-10-CM

## 2017-11-22 DIAGNOSIS — M7918 Myalgia, other site: Secondary | ICD-10-CM

## 2017-11-22 NOTE — Telephone Encounter (Signed)
-----   Message from Pollyann SavoyShaili Deveshwar, MD sent at 11/22/2017 11:32 AM EDT ----- Please add myositis assessment panel.

## 2017-11-22 NOTE — Progress Notes (Signed)
Please add myositis assessment panel.

## 2017-11-23 LAB — COMPLETE METABOLIC PANEL WITH GFR
AG Ratio: 1.5 (calc) (ref 1.0–2.5)
ALT: 34 U/L — ABNORMAL HIGH (ref 6–29)
AST: 21 U/L (ref 10–30)
Albumin: 4.8 g/dL (ref 3.6–5.1)
Alkaline phosphatase (APISO): 79 U/L (ref 33–115)
BUN: 13 mg/dL (ref 7–25)
CO2: 33 mmol/L — ABNORMAL HIGH (ref 20–32)
Calcium: 9.9 mg/dL (ref 8.6–10.2)
Chloride: 100 mmol/L (ref 98–110)
Creat: 0.88 mg/dL (ref 0.50–1.10)
GFR, Est African American: 109 mL/min/{1.73_m2} (ref >=60)
GFR, Est Non African American: 94 mL/min/{1.73_m2} (ref >=60)
Globulin: 3.1 g/dL (calc) (ref 1.9–3.7)
Glucose, Bld: 88 mg/dL (ref 65–99)
Potassium: 4.1 mmol/L (ref 3.5–5.3)
Sodium: 140 mmol/L (ref 135–146)
Total Bilirubin: 0.4 mg/dL (ref 0.2–1.2)
Total Protein: 7.9 g/dL (ref 6.1–8.1)

## 2017-11-23 LAB — SEDIMENTATION RATE: Sed Rate: 2 mm/h (ref 0–20)

## 2017-11-23 LAB — PROTEIN ELECTROPHORESIS, SERUM, WITH REFLEX
Albumin ELP: 4.8 g/dL (ref 3.8–4.8)
Alpha 1: 0.3 g/dL (ref 0.2–0.3)
Alpha 2: 1 g/dL — ABNORMAL HIGH (ref 0.5–0.9)
Beta 2: 0.4 g/dL (ref 0.2–0.5)
Beta Globulin: 0.6 g/dL (ref 0.4–0.6)
Gamma Globulin: 1.2 g/dL (ref 0.8–1.7)
Total Protein: 8.1 g/dL (ref 6.1–8.1)

## 2017-11-23 LAB — GLUCOSE 6 PHOSPHATE DEHYDROGENASE: G-6PDH: 15.8 U/g Hgb (ref 7.0–20.5)

## 2017-11-23 LAB — CK: Total CK: 536 U/L — ABNORMAL HIGH (ref 29–143)

## 2017-11-23 LAB — RHEUMATOID FACTOR: Rhuematoid fact SerPl-aCnc: <14 IU/mL (ref <14)

## 2017-11-23 LAB — CYCLIC CITRUL PEPTIDE ANTIBODY, IGG: Cyclic Citrullin Peptide Ab: <16 UNITS

## 2017-11-23 NOTE — Progress Notes (Signed)
When patient comes for aldolase and myositis panel please add another CK.

## 2017-11-29 ENCOUNTER — Emergency Department (HOSPITAL_COMMUNITY)
Admission: EM | Admit: 2017-11-29 | Discharge: 2017-11-29 | Disposition: A | Discharge status: Home / Self Care | Payer: BLUE CROSS/BLUE SHIELD | Attending: Emergency Medicine | Admitting: Emergency Medicine

## 2017-11-29 ENCOUNTER — Other Ambulatory Visit: Payer: Self-pay

## 2017-11-29 ENCOUNTER — Encounter (HOSPITAL_COMMUNITY): Payer: Self-pay

## 2017-11-29 ENCOUNTER — Emergency Department (HOSPITAL_COMMUNITY): Payer: BLUE CROSS/BLUE SHIELD

## 2017-11-29 DIAGNOSIS — R9431 Abnormal electrocardiogram [ECG] [EKG]: Secondary | ICD-10-CM | POA: Diagnosis not present

## 2017-11-29 DIAGNOSIS — Z87891 Personal history of nicotine dependence: Secondary | ICD-10-CM | POA: Insufficient documentation

## 2017-11-29 DIAGNOSIS — R404 Transient alteration of awareness: Secondary | ICD-10-CM | POA: Diagnosis not present

## 2017-11-29 DIAGNOSIS — R457 State of emotional shock and stress, unspecified: Secondary | ICD-10-CM | POA: Diagnosis not present

## 2017-11-29 DIAGNOSIS — Z79899 Other long term (current) drug therapy: Secondary | ICD-10-CM | POA: Insufficient documentation

## 2017-11-29 DIAGNOSIS — F419 Anxiety disorder, unspecified: Secondary | ICD-10-CM | POA: Diagnosis present

## 2017-11-29 DIAGNOSIS — R29818 Other symptoms and signs involving the nervous system: Secondary | ICD-10-CM | POA: Diagnosis not present

## 2017-11-29 LAB — URINALYSIS, ROUTINE W REFLEX MICROSCOPIC
Bilirubin Urine: NEGATIVE
Glucose, UA: NEGATIVE mg/dL
Hgb urine dipstick: NEGATIVE
Ketones, ur: NEGATIVE mg/dL
Nitrite: NEGATIVE
Protein, ur: NEGATIVE mg/dL
Specific Gravity, Urine: 1.015 (ref 1.005–1.030)
pH: 9 — ABNORMAL HIGH (ref 5.0–8.0)

## 2017-11-29 LAB — CBC WITH DIFFERENTIAL/PLATELET
Basophils Absolute: 0 10*3/uL (ref 0.0–0.1)
Basophils Relative: 0 %
Eosinophils Absolute: 0.1 10*3/uL (ref 0.0–0.7)
Eosinophils Relative: 1 %
HCT: 50.2 % — ABNORMAL HIGH (ref 36.0–46.0)
Hemoglobin: 16.7 g/dL — ABNORMAL HIGH (ref 12.0–15.0)
Lymphocytes Relative: 18 %
Lymphs Abs: 1.7 10*3/uL (ref 0.7–4.0)
MCH: 28.8 pg (ref 26.0–34.0)
MCHC: 33.3 g/dL (ref 30.0–36.0)
MCV: 86.6 fL (ref 78.0–100.0)
Monocytes Absolute: 0.7 10*3/uL (ref 0.1–1.0)
Monocytes Relative: 7 %
Neutro Abs: 7 10*3/uL (ref 1.7–7.7)
Neutrophils Relative %: 74 %
Platelets: 390 10*3/uL (ref 150–400)
RBC: 5.8 MIL/uL — ABNORMAL HIGH (ref 3.87–5.11)
RDW: 13.6 % (ref 11.5–15.5)
WBC: 9.5 10*3/uL (ref 4.0–10.5)

## 2017-11-29 LAB — COMPREHENSIVE METABOLIC PANEL
ALT: 33 U/L (ref 0–44)
AST: 25 U/L (ref 15–41)
Albumin: 4.7 g/dL (ref 3.5–5.0)
Alkaline Phosphatase: 67 U/L (ref 38–126)
Anion gap: 11 (ref 5–15)
BUN: 11 mg/dL (ref 6–20)
CO2: 27 mmol/L (ref 22–32)
Calcium: 9.6 mg/dL (ref 8.9–10.3)
Chloride: 102 mmol/L (ref 98–111)
Creatinine, Ser: 0.83 mg/dL (ref 0.44–1.00)
GFR calc Af Amer: >60 mL/min (ref >60)
GFR calc non Af Amer: >60 mL/min (ref >60)
Glucose, Bld: 97 mg/dL (ref 70–99)
Potassium: 3.7 mmol/L (ref 3.5–5.1)
Sodium: 140 mmol/L (ref 135–145)
Total Bilirubin: 0.4 mg/dL (ref 0.3–1.2)
Total Protein: 8.4 g/dL — ABNORMAL HIGH (ref 6.5–8.1)

## 2017-11-29 LAB — I-STAT TROPONIN, ED: Troponin i, poc: 0 ng/mL (ref 0.00–0.08)

## 2017-11-29 LAB — CBG MONITORING, ED: Glucose-Capillary: 92 mg/dL (ref 70–99)

## 2017-11-29 LAB — I-STAT BETA HCG BLOOD, ED (MC, WL, AP ONLY): I-stat hCG, quantitative: <5 m[IU]/mL (ref <5)

## 2017-11-29 NOTE — ED Provider Notes (Signed)
[facility]-EMERGENCY DEPT Provider Note  CSN: [id number] Arrival date & time: 11/29/17  1109    History   Chief Complaint Chief Complaint  Patient presents with  . Anxiety    HPI Shelia Walker is a 22 y.o. Transitioning female to female with a psychiatric history of PTSD, depression and anxiety and no significant medical history who presented to the ED for an amalgamation of symptoms. He states that he was in class when he started feeling an anxiety attack precipitating and described hyperventilation and panic. Then he reports decreased vision in his left eye, inability to speak, feeling confused and unable to move which was atypical for him. Part of this episode was witnessed by patient's partner who states that the patient's speech was unintelligible. A classmate called EMS and patient states that symptoms resolved en route to the ED. Denies chest pain, SOB, N/V, diaphoresis or LOC. Denies head trauma, substance or EtOH use.  combination.  Patient reports that family history is significant for stroke and that his mother had a stroke in her 30s and has a brain aneurysm.   Past Medical History:  Diagnosis Date  . Anxiety   . Depression   . Migraines   . PTSD (post-traumatic stress disorder)   . Transgender     Patient Active Problem List   Diagnosis Date Noted  . Positive ANA (antinuclear antibody) 11/07/2017  . 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 Severe episode of recurrent major depressive disorder, without psychotic features (HCC) 11/04/2017  . Myalgia 11/04/2017    Past Surgical History:  Procedure Laterality Date  . WISDOM TOOTH EXTRACTION       OB History   None      Home Medications    Prior to Admission medications   Medication Sig Start Date End Date Taking? Authorizing Provider  FLUoxetine (PROZAC) 20 MG tablet Take 1 tablet (20 mg total) by mouth daily. 11/18/17  Yes Nche, Bonna Gains, NP  naproxen sodium (ALEVE) 220 MG tablet  Take 880 mg by mouth daily as needed (pain).   Yes [provider]  testosterone cypionate (DEPOTESTOSTERONE CYPIONATE) 200 MG/ML injection Inject 200 mg into the muscle every 14 (fourteen) days.  10/21/17  Yes [provider]  B-D 3CC LUER-LOK SYR 25GX1/2" 25G X 1-1/2" 3 ML MISC INJECT 1 ML Q OTHER WEEK INTO MUSCLE UTD 10/21/17   [provider]  BD DISP NEEDLES 22G X 1-1/2" MISC USE UTD 10/21/17   [provider]  phenazopyridine (PYRIDIUM) 200 MG tablet Take 1 tablet (200 mg total) by mouth 3 (three) times daily. Patient not taking: Reported on 11/04/2017 08/12/17   Wurst, [place], PA-C  traZODone (DESYREL) 50 MG tablet Take 0.5-1 tablets (25-50 mg total) by mouth at bedtime as needed for sleep. Patient not taking: Reported on 11/29/2017 11/18/17   Nche, Bonna Gains, NP    Family History Family History  Problem Relation Age of Onset  . Fibromyalgia Mother   . Cerebral aneurysm Mother   . Hyperlipidemia Mother   . Hypertension Mother   . Stroke Mother   . Depression Mother   . Autoimmune disease Mother   . Depression Father   . Fibromyalgia Maternal Grandmother   . Arthritis Maternal Grandmother   . Diabetes Maternal Grandmother   . Depression Maternal Grandmother   . Depression Sister   . Learning disabilities Sister   . Mental illness Sister   . Healthy Brother     Social History Social History  Tobacco Use  . Smoking status: Former Games developer  . Smokeless tobacco: Never Used  Substance Use Topics  . Alcohol use: Yes    Comment: social  . Drug use: Yes    Types: Marijuana    Comment: social     Allergies   Ginger and Pyridium [phenazopyridine hcl]   Review of Systems Review of Systems  Constitutional: Negative.   HENT: Negative.   Eyes: Positive for visual disturbance.  Respiratory: Negative for cough, choking, chest tightness and shortness of breath.   Cardiovascular: Negative for chest pain and palpitations.  Gastrointestinal:  Negative.   Genitourinary: Negative.   Musculoskeletal: Negative.   Skin: Negative.   Neurological: Positive for speech difficulty and numbness. Negative for syncope, facial asymmetry, weakness, light-headedness and headaches.  Psychiatric/Behavioral: Positive for confusion.   Physical Exam Updated Vital Signs BP 129/77   Pulse 86   Temp 98.3 F (36.8 C) (Oral)   Resp 18   Ht 5\' 3"  (1.6 m)   Wt 72.8 kg   SpO2 98%   BMI 28.43 kg/m   Physical Exam  Constitutional: [name], place, and time. Vital signs are normal. She appears well-developed and well-nourished. She is cooperative. No distress.  HENT:  Head: Normocephalic and atraumatic.  Mouth/Throat: Oropharynx is clear and moist and mucous membranes are normal.  Right side uvula deviation.  Eyes: Pupils are equal, round, and reactive to light. Conjunctivae, EOM and lids are normal.  Bilateral pupils reactive to light, but right pupil more delayed than left with reaction. Normal visual acuity.  Neck: Normal range of motion and full passive range of motion without pain. Neck supple. No spinous process tenderness and no muscular tenderness present. Normal range of motion present.  Cardiovascular: Normal rate, regular rhythm and normal heart sounds.  No murmur heard. Pulmonary/Chest: Effort normal and breath sounds normal.  Musculoskeletal: Normal range of motion.  Neurological: She is alert and oriented to person, place, and time. She has normal strength. No cranial nerve deficit or sensory deficit. She exhibits normal muscle tone. Coordination and gait normal. GCS eye subscore is 4. GCS verbal subscore is 5. GCS motor subscore is 6.  Reflex Scores:      Tricep reflexes are 2+ on the right side and 2+ on the left side.      Bicep reflexes are 2+ on the right side and 2+ on the left side.      Brachioradialis reflexes are 2+ on the right side and 2+ on the left side.      Patellar reflexes are 2+ on the right side  and 2+ on the left side.      Achilles reflexes are 2+ on the right side and 2+ on the left side. Skin: Skin is warm and intact. Capillary refill takes less than 2 seconds.  Psychiatric: She has a normal mood and affect. [name]. Thought content normal. Cognition and memory are normal.  Nursing note and vitals reviewed.  ED Treatments / Results  Labs (all labs ordered are listed, but only abnormal results are displayed) Labs Reviewed  CBC WITH DIFFERENTIAL/PLATELET - Abnormal; Notable for the following components:      Result Value   RBC 5.80 (*)    Hemoglobin 16.7 (*)    HCT 50.2 (*)    All other components within normal limits  COMPREHENSIVE METABOLIC PANEL - Abnormal; Notable for the following components:   Total Protein 8.4 (*)  All other components within normal limits  URINALYSIS, ROUTINE W REFLEX MICROSCOPIC - Abnormal; Notable for the following components:   APPearance HAZY (*)    pH 9.0 (*)    Leukocytes, UA SMALL (*)    Bacteria, UA RARE (*)    All other components within normal limits  CBG MONITORING, ED  I-STAT BETA HCG BLOOD, ED ([facility], [facility], [facility] ONLY)  I-STAT TROPONIN, ED    EKG EKG Interpretation  Date/Time:  Tuesday November 29 2017 13:27:12 EDT Ventricular Rate:  80 PR Interval:    QRS Duration: 82 QT Interval:  342 QTC Calculation: 395 R Axis:   65 Text Interpretation:  Sinus rhythm Borderline short PR interval Borderline T wave abnormalities No previous ECGs available Confirmed by Richardean Canal [phone]) on 11/29/2017 1:40:23 PM   Radiology Ct Head Wo Contrast  Result Date: 11/29/2017 CLINICAL DATA:  Subacute neuro deficits EXAM: CT HEAD WITHOUT CONTRAST TECHNIQUE: Contiguous axial images were obtained from the base of the skull through the vertex without intravenous contrast. COMPARISON:  None. FINDINGS: Brain: The ventricular system is normal in size and configuration and the septum is in a normal midline position. The  fourth ventricle and basilar cisterns are unremarkable. No hemorrhage, mass lesion, or acute infarction is seen. Vascular: No vascular abnormality is seen on this unenhanced study. Skull: On bone window images, no calvarial abnormality is seen. Sinuses/Orbits: The paranasal sinuses are well pneumatized Other: None IMPRESSION: .  Negative unenhanced CT of the brain Electronically Signed   By: Dwyane Dee M.D.   On: 11/29/2017 13:54    Procedures Procedures (including critical care time)  Medications Ordered in ED Medications - No data to display   Initial Impression / Assessment and Plan / ED Course  Triage vital signs and the nursing notes have been reviewed.  Pertinent labs & imaging results that were available during care of the patient were reviewed and considered in medical decision making (see chart for details).  Patient presents to the ED following a 15-20 minute episode where he had various neuro symptoms which included vision changes, paresthesias, speech changes and AMS. Preceding symptoms of hyperventilation and panic that patient experienced are consistent with his previous anxiety attacks. Physical exam is grossly unremarkable except for uvula deviation and unilateral delayed pupil reaction There are no other focal neuro deficits on exam. History does not include any head trauma, substance or EtOH use. Family history significant for premature neuro issues which raises concern for an intracranial process. Will proceed with CT head for further evaluation in addition to basic labs.  Clinical Course as of Nov 29 1449  Tue Nov 29, 2017  1411 EKG showed NSR. No ST elevations/depressions or signs of acute ischemia or infarct. This is reassuring in combination with negative troponin which assists in evaluating and ruling out an acute cardiac process. Labs unremarkable. CT head normal. No acute intracranial process such as infarct or hemorrhage seen.   [GM]    Clinical Course User  Index [GM] Shantai Tiedeman, Sharyon Medicus, PA-C    Final Clinical Impressions(s) / ED Diagnoses  1. Altered Awareness. CT head and lab work normal. No clear etiology. Possible seizure activity. Education provided on s/s that would warrant return to the ED vs PCP. Referral to neurology given for follow-up if desired.  Dispo: Home. After thorough clinical evaluation, this patient is determined to be medically stable and can be safely discharged with the previously mentioned treatment and/or outpatient follow-up/referral(s). At this time, there are no other  apparent medical conditions that require further screening, evaluation or treatment.   Final diagnoses:  Altered awareness, transient    ED Discharge Orders    None        Windy Carina, [place] 11/29/17 1451    Charlynne Pander, MD 11/29/17 [phone]

## 2017-11-29 NOTE — ED Triage Notes (Addendum)
Per EMS-Patient c/o of anxiety that occurred in the middle of music class today at [facility]. Staff reported that the patient was holding his breath during the episode.  Patient states in triage that this did not feel like their usual anxiety attacks that they normally have.

## 2017-11-29 NOTE — Discharge Instructions (Addendum)
Your blood work and head CT today were normal. It is difficult to say what occurred today without being present for the event. However, there were no signs of stroke or brain bleeding on imaging which is reassuring.  If you have another episode like this, please follow-up to the emergency department afterward. I have placed information on a neurology group that you can follow-up with below.  Please try to move up your PCP appointment so you can talk with them about today's episode.  I wish you the best of luck with school. Thank you for allowing me to take care of you today!

## 2017-11-30 ENCOUNTER — Telehealth: Payer: Self-pay | Admitting: *Deleted

## 2017-11-30 DIAGNOSIS — F640 Transsexualism: Secondary | ICD-10-CM | POA: Diagnosis not present

## 2017-11-30 NOTE — Telephone Encounter (Signed)
Patient was seen in the emergency room last night for what was thought was to be possible stroke. Patient's CT Scan of the head was normal. No acute intracranial process such as infarct or hemorrhage seen. The ED Doctor states possible seizure activity. Recommendations were to follow up with PCP and neurology . Patient states was also told to follow up with [place]. Patient has a follow up appointment scheduled for 12/19/17. Would you like for [place] to schedule patient to be seen sooner.

## 2017-11-30 NOTE — Telephone Encounter (Signed)
Per Dr. Corliss Skains aptietn does not need a sooner appointment in our office. The recommendation to see Neurology is the right recommendation. Patient advised. Patient advised that we were going to be referring patient to neurology as  muscle enzymes were elevated and patient would need a EMG and possible muscle biopsy. Patient advised to have them only use one leg to do the EMG per Dr. Corliss Skains as we may need to use the other leg for a muscle biopsy. Patient verbalized understanding.

## 2017-12-01 ENCOUNTER — Ambulatory Visit: Payer: BLUE CROSS/BLUE SHIELD | Admitting: Neurology

## 2017-12-01 ENCOUNTER — Encounter: Payer: Self-pay | Admitting: Neurology

## 2017-12-01 VITALS — BP 126/82 | HR 96 | Ht 63.0 in | Wt 159.0 lb

## 2017-12-01 DIAGNOSIS — M791 Myalgia, unspecified site: Secondary | ICD-10-CM

## 2017-12-01 DIAGNOSIS — R404 Transient alteration of awareness: Secondary | ICD-10-CM | POA: Diagnosis not present

## 2017-12-01 DIAGNOSIS — Z8249 Family history of ischemic heart disease and other diseases of the circulatory system: Secondary | ICD-10-CM

## 2017-12-01 NOTE — Progress Notes (Signed)
NEUROLOGY CONSULTATION NOTE  Shelia Walker MRN: [id number] DOB: [birth year]  Referring provider: Chaney Malling, MD (ED referral) Primary care provider: Anne Ng, NP  Reason for consult:  Altered mental status  HISTORY OF PRESENT ILLNESS: Shelia Walker is a 22 year old right-handed transgender female with depression, anxiety, PTSD and migraines who presents for altered mental status.  History supplemented by ED note.  She presented to the ED on 11/29/17.  He was in class when he suddenly started feeling his habitual panic attack, he had trouble breathing, he was shaky in the hands.  He then developed decreased vision in the left eye, he had difficulty forming words, pain in back and calves,  unable to move and feeling confused (trouble remembering conversations).  Episode was witnessed by his partner.  EMS was called.  He did not lose consciousness.  No associated tongue biting or urinary or bowel incontinence.  Symptoms lasted about 30 minutes and resolved while in the ambulance.  He has never had a similar event.  CT of head was personally reviewed and was unremarkable.  There were no electrolyte abnormalities.  UA did reveal small leukocytes and rare bacteria.  Troponin was negative.  As per ED note, exam was noted for unilateral sluggish pupil and uvula deviation.  Otherwise, exam unremarkable.  Possible seizure was suggested.  Since then, he still feels shaky.  He is having more difficulty holding a pen.  In class, he had trouble with recalling information from class.  He is also currently being evaluated by rheumatology for myalgias, arthralgias and fatigue since February 2019.  He had a positive ANA 1:640 speckled, sed rate 32, TSH 1.80, negative RF.  CK from 11/21/17 was 536 and aldolase 10.4.  A Myositis panel is currently in process.  NCV-EMG is requested by rheumatology and they plan on ordering a muscle biopsy.  Family history noted that his mother has had 3 strokes.  The first  stroke was at age 70.  She has had 2 other strokes, the last one in 2015.  His mother also has a cerebral aneurysm.  He has no other family history of aneurysms.  PAST MEDICAL HISTORY: Past Medical History:  Diagnosis Date  . Anxiety   . Depression   . Migraines   . PTSD (post-traumatic stress disorder)   . Transgender     PAST SURGICAL HISTORY: Past Surgical History:  Procedure Laterality Date  . WISDOM TOOTH EXTRACTION      MEDICATIONS: Current Outpatient Medications on File Prior to Visit  Medication Sig Dispense Refill  . B-D 3CC LUER-LOK SYR 25GX1/2" 25G X 1-1/2" 3 ML MISC INJECT 1 ML Q OTHER WEEK INTO MUSCLE UTD  1  . BD DISP NEEDLES 22G X 1-1/2" MISC USE UTD  1  . FLUoxetine (PROZAC) 20 MG tablet Take 1 tablet (20 mg total) by mouth daily. 90 tablet 1  . naproxen sodium (ALEVE) 220 MG tablet Take 880 mg by mouth daily as needed (pain).    . phenazopyridine (PYRIDIUM) 200 MG tablet Take 1 tablet (200 mg total) by mouth 3 (three) times daily. (Patient not taking: Reported on 11/04/2017) 6 tablet 0  . testosterone cypionate (DEPOTESTOSTERONE CYPIONATE) 200 MG/ML injection Inject 200 mg into the muscle every 14 (fourteen) days.   0  . traZODone (DESYREL) 50 MG tablet Take 0.5-1 tablets (25-50 mg total) by mouth at bedtime as needed for sleep. (Patient not taking: Reported on 11/29/2017) 30 tablet 1   No current facility-administered medications on  file prior to visit.     ALLERGIES: Allergies  Allergen Reactions  . Ginger Anaphylaxis  . Pyridium [Phenazopyridine Hcl] Nausea And Vomiting    headache    FAMILY HISTORY: Family History  Problem Relation Age of Onset  . Fibromyalgia Mother   . Cerebral aneurysm Mother   . Hyperlipidemia Mother   . Hypertension Mother   . Stroke Mother   . Depression Mother   . Autoimmune disease Mother   . Depression Father   . Fibromyalgia Maternal Grandmother   . Arthritis Maternal Grandmother   . Diabetes Maternal Grandmother   .  Depression Maternal Grandmother   . Depression Sister   . Learning disabilities Sister   . Mental illness Sister   . Healthy Brother     SOCIAL HISTORY: Social History   Socioeconomic History  . Marital status: Single    Spouse name: Not on file  . Number of children: Not on file  . Years of education: Not on file  . Highest education level: Not on file  Occupational History  . Not on file  Social Needs  . Financial resource strain: Not on file  . Food insecurity:    Worry: Not on file    Inability: Not on file  . Transportation needs:    Medical: Not on file    Non-medical: Not on file  Tobacco Use  . Smoking status: Former Games developer  . Smokeless tobacco: Never Used  Substance and Sexual Activity  . Alcohol use: Yes    Comment: social  . Drug use: Yes    Types: Marijuana    Comment: social  . Sexual activity: Not on file  Lifestyle  . Physical activity:    Days per week: Not on file    Minutes per session: Not on file  . Stress: Not on file  Relationships  . Social connections:    Talks on phone: Not on file    Gets together: Not on file    Attends religious service: Not on file    Active member of club or organization: Not on file    Attends meetings of clubs or organizations: Not on file    Relationship status: Not on file  . Intimate partner violence:    Fear of current or ex partner: Not on file    Emotionally abused: Not on file    Physically abused: Not on file    Forced sexual activity: Not on file  Other Topics Concern  . Not on file  Social History Narrative  . Not on file    REVIEW OF SYSTEMS: Constitutional: No fevers, chills, or sweats, no generalized fatigue, change in appetite Eyes: No visual changes, double vision, eye pain Ear, nose and throat: No hearing loss, ear pain, nasal congestion, sore throat Cardiovascular: No chest pain, palpitations Respiratory:  No shortness of breath at rest or with exertion, wheezes GastrointestinaI: No  nausea, vomiting, diarrhea, abdominal pain, fecal incontinence Genitourinary:  No dysuria, urinary retention or frequency Musculoskeletal:  No neck pain, back pain Integumentary: No rash, pruritus, skin lesions Neurological: as above Psychiatric: No depression, insomnia, anxiety Endocrine: No palpitations, fatigue, diaphoresis, mood swings, change in appetite, change in weight, increased thirst Hematologic/Lymphatic:  No purpura, petechiae. Allergic/Immunologic: no itchy/runny eyes, nasal congestion, recent allergic reactions, rashes  PHYSICAL EXAM: Blood pressure 126/82, pulse 96, height 5\' 3"  (1.6 m), weight 159 lb (72.1 kg), SpO2 98 %. General: No acute distress.  Patient appears well-groomed.   Head:  Normocephalic/atraumatic Eyes:  fundi examined but not visualized Neck: supple, no paraspinal tenderness, full range of motion Back: No paraspinal tenderness Heart: regular rate and rhythm Lungs: Clear to auscultation bilaterally. Vascular: No carotid bruits. Neurological Exam: Mental status: alert and oriented to person, place, and time, recent and remote memory intact, fund of knowledge intact, attention and concentration intact, speech fluent and not dysarthric, language intact. Cranial nerves: CN I: not tested CN II: pupils equal, round and reactive to light, visual fields intact CN III, IV, VI:  full range of motion, no nystagmus, no ptosis CN V: facial sensation intact CN VII: upper and lower face symmetric CN VIII: hearing intact CN IX, X: gag intact, uvula midline CN XI: sternocleidomastoid and trapezius muscles intact CN XII: tongue midline Bulk & Tone: normal, no fasciculations. Motor:  5/5 throughout  Sensation: temperature and vibration sensation intact.  . Deep Tendon Reflexes:  2+ throughout, toes downgoing.   Finger to nose testing:  Without dysmetria.   Heel to shin:  Without dysmetria.   Gait:  Normal station and stride.  Able to turn and tandem walk. Romberg  negative.  IMPRESSION: 1.  Transient altered awareness.  I suspect anxiety attack.  Seizure is possible but less likely.  There is a family history of stroke at young age, but symptoms are not consistent with stroke. 2.  Myalgias with elevated CK and aldolase 3.  Family history of cerebral aneurysm  PLAN: 1.  We will get MRI and MRA of head. 2.  We will get a sleep-deprived EEG 3.  At rheumatology's request, we will order NCV-EMG.  Myositis panel pending.  May need muscle biopsy 4.  Follow up after testing  Thank you for allowing me to take part in the care of this patient.  Shon Millet, DO  CC:  [name], NP  Melvenia Needles, MD (rheumatology)

## 2017-12-01 NOTE — Patient Instructions (Addendum)
1.  We will check MRI of brain  And MRA of head. 2.  We will check a sleep-deprived EEG 3.  We will check NCV-EMG to evaluate for myopathy 4.  Follow up after testing  We have sent a referral to [facility] for your MRI/MRA and they will call you directly to schedule your appt. They are located at [address] [place]. If you need to contact them directly please call [phone].

## 2017-12-02 ENCOUNTER — Ambulatory Visit (INDEPENDENT_AMBULATORY_CARE_PROVIDER_SITE_OTHER): Payer: BLUE CROSS/BLUE SHIELD | Admitting: Nurse Practitioner

## 2017-12-02 ENCOUNTER — Encounter: Payer: Self-pay | Admitting: Nurse Practitioner

## 2017-12-02 VITALS — BP 114/72 | HR 91 | Temp 98.1°F | Ht 63.0 in | Wt 159.8 lb

## 2017-12-02 DIAGNOSIS — F332 Major depressive disorder, recurrent severe without psychotic features: Secondary | ICD-10-CM | POA: Diagnosis not present

## 2017-12-02 DIAGNOSIS — G4700 Insomnia, unspecified: Secondary | ICD-10-CM | POA: Diagnosis not present

## 2017-12-02 DIAGNOSIS — R404 Transient alteration of awareness: Secondary | ICD-10-CM

## 2017-12-02 NOTE — Progress Notes (Signed)
Subjective:  Patient ID: Shelia Walker, female    DOB: 1996-01-31  Age: 22 y.o. MRN: [id number]  CC: Hospitalization Follow-up (ED follow up/still shaky,trouble processing thought at times/ saw neurology yesterday--will be doing alot of test. )  HPI  Shelia Walker is here for hospital follow up. He was evaluated in hospital and by neurology after an episode of altered awareness and blurry vision while in class. His vision is now now, but has persistent difficulty focusing in class or studying. Reports difficulty comprehending school material he did not have difficulty with in past.  Insomnia: He was not able to tolerate trazodone. Caused residual drowsiness.  Depression: Mood is stable with prozac.  Reviewed past Medical, Social and Family history today.  Outpatient Medications Prior to Visit  Medication Sig Dispense Refill  . B-D 3CC LUER-LOK SYR 25GX1/2" 25G X 1-1/2" 3 ML MISC INJECT 1 ML Q OTHER WEEK INTO MUSCLE UTD  1  . BD DISP NEEDLES 22G X 1-1/2" MISC USE UTD  1  . FLUoxetine (PROZAC) 20 MG tablet Take 1 tablet (20 mg total) by mouth daily. 90 tablet 1  . naproxen sodium (ALEVE) 220 MG tablet Take 880 mg by mouth daily as needed (pain).    Marland Kitchen testosterone cypionate (DEPOTESTOSTERONE CYPIONATE) 200 MG/ML injection Inject 200 mg into the muscle every 14 (fourteen) days.   0  . phenazopyridine (PYRIDIUM) 200 MG tablet Take 1 tablet (200 mg total) by mouth 3 (three) times daily. (Patient not taking: Reported on 11/04/2017) 6 tablet 0  . traZODone (DESYREL) 50 MG tablet Take 0.5-1 tablets (25-50 mg total) by mouth at bedtime as needed for sleep. (Patient not taking: Reported on 11/29/2017) 30 tablet 1   No facility-administered medications prior to visit.     ROS See HPI  Objective:  BP 114/72   Pulse 91   Temp 98.1 F (36.7 C) (Oral)   Ht 5\' 3"  (1.6 m)   Wt 159 lb 12.8 oz (72.5 kg)   SpO2 98%   BMI 28.31 kg/m   BP Readings from Last 3 Encounters:  12/02/17 114/72  12/01/17  126/82  11/29/17 129/80    Wt Readings from Last 3 Encounters:  12/02/17 159 lb 12.8 oz (72.5 kg)  12/01/17 159 lb (72.1 kg)  11/29/17 160 lb 8 oz (72.8 kg)    Physical Exam  Constitutional: [name], place, and time.  Eyes: Pupils are equal, round, and reactive to light. Conjunctivae and EOM are normal. No scleral icterus.  Neck: Normal range of motion. Neck supple. No thyromegaly present.  Cardiovascular: Normal rate, regular rhythm, normal heart sounds and intact distal pulses.  Pulmonary/Chest: Effort normal.  Neurological: She is alert and oriented to person, place, and time.  Psychiatric: She has a normal mood and affect. Her behavior is normal. Thought content normal.  Vitals reviewed.   Lab Results  Component Value Date   WBC 9.5 11/29/2017   HGB 16.7 (H) 11/29/2017   HCT 50.2 (H) 11/29/2017   PLT 390 11/29/2017   GLUCOSE 97 11/29/2017   ALT 33 11/29/2017   AST 25 11/29/2017   NA 140 11/29/2017   K 3.7 11/29/2017   CL 102 11/29/2017   CREATININE 0.83 11/29/2017   BUN 11 11/29/2017   CO2 27 11/29/2017   TSH 1.80 11/04/2017   HGBA1C 5.7 11/04/2017    Ct Head Wo Contrast  Result Date: 11/29/2017 CLINICAL DATA:  Subacute neuro deficits EXAM: CT HEAD WITHOUT CONTRAST TECHNIQUE: Contiguous axial images  were obtained from the base of the skull through the vertex without intravenous contrast. COMPARISON:  None. FINDINGS: Brain: The ventricular system is normal in size and configuration and the septum is in a normal midline position. The fourth ventricle and basilar cisterns are unremarkable. No hemorrhage, mass lesion, or acute infarction is seen. Vascular: No vascular abnormality is seen on this unenhanced study. Skull: On bone window images, no calvarial abnormality is seen. Sinuses/Orbits: The paranasal sinuses are well pneumatized Other: None IMPRESSION: .  Negative unenhanced CT of the brain Electronically Signed   By: Dwyane Dee M.D.   On: 11/29/2017  13:54    Assessment & Plan:   Abrie was seen today for hospitalization follow-up.  Diagnoses and all orders for this visit:  Severe episode of recurrent major depressive disorder, without psychotic features (HCC)  Transient alteration of awareness  Insomnia, unspecified type   I have discontinued Shelia Walker "Shelia Walker"'s traZODone. I am also having her maintain her phenazopyridine, BD DISP NEEDLES, B-D 3CC LUER-LOK SYR 25GX1/2", testosterone cypionate, FLUoxetine, and naproxen sodium.  No orders of the defined types were placed in this encounter.   Follow-up: Return in about 3 months (around 03/04/2018) for depression and insomnia.  [name], NP

## 2017-12-02 NOTE — Patient Instructions (Addendum)
We will hold on prescribing another sleep aid at this time, till work up with neurology is completed.  Maintain appt with neurology and rheumatology.  Reschedule appt with me for 3months.  Let me know if any school accommodation paperwork needs to be completed.

## 2017-12-03 ENCOUNTER — Encounter (HOSPITAL_COMMUNITY): Payer: Self-pay

## 2017-12-03 ENCOUNTER — Other Ambulatory Visit: Payer: Self-pay

## 2017-12-03 ENCOUNTER — Emergency Department (HOSPITAL_COMMUNITY): Payer: BLUE CROSS/BLUE SHIELD

## 2017-12-03 ENCOUNTER — Emergency Department (HOSPITAL_COMMUNITY)
Admission: EM | Admit: 2017-12-03 | Discharge: 2017-12-03 | Disposition: A | Discharge status: Home / Self Care | Payer: BLUE CROSS/BLUE SHIELD | Attending: Emergency Medicine | Admitting: Emergency Medicine

## 2017-12-03 DIAGNOSIS — H538 Other visual disturbances: Secondary | ICD-10-CM | POA: Diagnosis not present

## 2017-12-03 DIAGNOSIS — R519 Headache, unspecified: Secondary | ICD-10-CM

## 2017-12-03 DIAGNOSIS — R718 Other abnormality of red blood cells: Secondary | ICD-10-CM | POA: Insufficient documentation

## 2017-12-03 DIAGNOSIS — T387X5A Adverse effect of androgens and anabolic congeners, initial encounter: Secondary | ICD-10-CM | POA: Diagnosis not present

## 2017-12-03 DIAGNOSIS — Z79899 Other long term (current) drug therapy: Secondary | ICD-10-CM | POA: Insufficient documentation

## 2017-12-03 DIAGNOSIS — R251 Tremor, unspecified: Secondary | ICD-10-CM | POA: Diagnosis not present

## 2017-12-03 DIAGNOSIS — R4789 Other speech disturbances: Secondary | ICD-10-CM | POA: Insufficient documentation

## 2017-12-03 DIAGNOSIS — Z87891 Personal history of nicotine dependence: Secondary | ICD-10-CM | POA: Insufficient documentation

## 2017-12-03 DIAGNOSIS — R569 Unspecified convulsions: Secondary | ICD-10-CM | POA: Diagnosis present

## 2017-12-03 DIAGNOSIS — R51 Headache: Secondary | ICD-10-CM | POA: Insufficient documentation

## 2017-12-03 DIAGNOSIS — R0602 Shortness of breath: Secondary | ICD-10-CM | POA: Diagnosis not present

## 2017-12-03 DIAGNOSIS — H53122 Transient visual loss, left eye: Secondary | ICD-10-CM | POA: Diagnosis not present

## 2017-12-03 LAB — URINALYSIS, ROUTINE W REFLEX MICROSCOPIC
Bilirubin Urine: NEGATIVE
Glucose, UA: NEGATIVE mg/dL
Hgb urine dipstick: NEGATIVE
Ketones, ur: NEGATIVE mg/dL
Nitrite: NEGATIVE
Protein, ur: 30 mg/dL — AB
Specific Gravity, Urine: 1.023 (ref 1.005–1.030)
WBC, UA: >50 WBC/hpf — ABNORMAL HIGH (ref 0–5)
pH: 7 (ref 5.0–8.0)

## 2017-12-03 LAB — COMPREHENSIVE METABOLIC PANEL
ALT: 43 U/L (ref 0–44)
AST: 28 U/L (ref 15–41)
Albumin: 4.9 g/dL (ref 3.5–5.0)
Alkaline Phosphatase: 73 U/L (ref 38–126)
Anion gap: 11 (ref 5–15)
BUN: 14 mg/dL (ref 6–20)
CO2: 28 mmol/L (ref 22–32)
Calcium: 9.8 mg/dL (ref 8.9–10.3)
Chloride: 103 mmol/L (ref 98–111)
Creatinine, Ser: 0.86 mg/dL (ref 0.44–1.00)
GFR calc Af Amer: >60 mL/min (ref >60)
GFR calc non Af Amer: >60 mL/min (ref >60)
Glucose, Bld: 90 mg/dL (ref 70–99)
Potassium: 4 mmol/L (ref 3.5–5.1)
Sodium: 142 mmol/L (ref 135–145)
Total Bilirubin: 0.8 mg/dL (ref 0.3–1.2)
Total Protein: 8.9 g/dL — ABNORMAL HIGH (ref 6.5–8.1)

## 2017-12-03 LAB — CBC WITH DIFFERENTIAL/PLATELET
Basophils Absolute: 0 10*3/uL (ref 0.0–0.1)
Basophils Relative: 0 %
Eosinophils Absolute: 0.1 10*3/uL (ref 0.0–0.7)
Eosinophils Relative: 1 %
HCT: 49.6 % — ABNORMAL HIGH (ref 36.0–46.0)
Hemoglobin: 16.6 g/dL — ABNORMAL HIGH (ref 12.0–15.0)
Lymphocytes Relative: 21 %
Lymphs Abs: 2.3 10*3/uL (ref 0.7–4.0)
MCH: 28.9 pg (ref 26.0–34.0)
MCHC: 33.5 g/dL (ref 30.0–36.0)
MCV: 86.3 fL (ref 78.0–100.0)
Monocytes Absolute: 0.9 10*3/uL (ref 0.1–1.0)
Monocytes Relative: 7 %
Neutro Abs: 8.1 10*3/uL — ABNORMAL HIGH (ref 1.7–7.7)
Neutrophils Relative %: 71 %
Platelets: 405 10*3/uL — ABNORMAL HIGH (ref 150–400)
RBC: 5.75 MIL/uL — ABNORMAL HIGH (ref 3.87–5.11)
RDW: 13.7 % (ref 11.5–15.5)
WBC: 11.4 10*3/uL — ABNORMAL HIGH (ref 4.0–10.5)

## 2017-12-03 LAB — MAGNESIUM: Magnesium: 2.1 mg/dL (ref 1.7–2.4)

## 2017-12-03 MED ORDER — SODIUM CHLORIDE 0.9 % IV BOLUS
1000.0000 mL | Freq: Once | INTRAVENOUS | Status: AC
  Administered 2017-12-03: 1000 mL via INTRAVENOUS

## 2017-12-03 MED ORDER — ACETAMINOPHEN 500 MG PO TABS
1000.0000 mg | ORAL_TABLET | Freq: Once | ORAL | Status: AC
  Administered 2017-12-03: 1000 mg via ORAL
  Filled 2017-12-03: qty 2

## 2017-12-03 MED ORDER — METOCLOPRAMIDE HCL 5 MG/ML IJ SOLN
10.0000 mg | Freq: Once | INTRAMUSCULAR | Status: AC
  Administered 2017-12-03: 10 mg via INTRAVENOUS
  Filled 2017-12-03: qty 2

## 2017-12-03 MED ORDER — DIAZEPAM 2 MG PO TABS
2.0000 mg | ORAL_TABLET | Freq: Once | ORAL | Status: AC
  Administered 2017-12-03: 2 mg via ORAL
  Filled 2017-12-03: qty 1

## 2017-12-03 MED ORDER — SODIUM CHLORIDE 0.9 % IV SOLN: INTRAVENOUS | Status: DC

## 2017-12-03 MED ORDER — DIPHENHYDRAMINE HCL 50 MG/ML IJ SOLN
25.0000 mg | Freq: Once | INTRAMUSCULAR | Status: AC
  Administered 2017-12-03: 25 mg via INTRAVENOUS
  Filled 2017-12-03: qty 1

## 2017-12-03 NOTE — ED Notes (Signed)
Pt to MRI

## 2017-12-03 NOTE — ED Notes (Signed)
Called [place] neuro.

## 2017-12-03 NOTE — Discharge Instructions (Signed)
Please see the information and instructions below regarding your visit.  Your diagnoses today include:  1. Tremor of both hands   2. Frontal headache   3. Elevated hematocrit     Tests performed today include: See side panel of your discharge paperwork for testing performed today. Vital signs are listed at the bottom of these instructions.   Fortunately, your workup today including labs are within normal limits.  You do have elevation in a number called a hematocrit on your complete blood count.  This is consistent over time, and consistent with a history of taking testosterone.  This is a side effect of testosterone.  Medications prescribed:    Take any prescribed medications only as prescribed, and any over the counter medications only as directed on the packaging.  Home care instructions:  Please follow any educational materials contained in this packet.   It is very important that you change your lifestyle in order to protect yourself and others from harm in the event that you have a seizure.  This includes:  No driving for 6 months after your most recent episode, and you must be cleared by a neurologist before resuming driving after this interval.  If you work in a job where you operate heavy machinery (e.g. Fork lifts), or any duty such that a sudden loss of consciousness could lead serious harm to yourself or others, you must cease that activity until cleared by a neurologist. Please ask your healthcare provider to provide any documentation necessary for your employer. Do not swim unsupervised or be around any body of water unsupervised (including bathtubs).  Do not climb ladders. Do not stay locked in enclosed spaces where no one could access you if you needed help. This includes bedrooms and bathrooms.  Assess all activity for safety before participating if a sudden loss of consciousness could lead to harm to self or others.  Follow-up instructions:  Please follow up with  your neurologist or establish care with the neurologist listed on your paperwork.   Return instructions:  Please return to the Emergency Department if you experience worsening symptoms.  Return to the emergency department if you have any further abnormal episodes of transient a develop any weakness/numbness of any arm/leg, confusion, slurred speech, or sudden/severe headache. Please return if you have any other emergent concerns.  Additional Information:   Your vital signs today were: BP (!) 100/50    Pulse 75    Temp 98.1 F (36.7 C) (Oral)    Resp 16    Ht 5\' 3"  (1.6 m)    Wt 72 kg    SpO2 96%    BMI 28.12 kg/m  If your blood pressure (BP) was elevated on multiple readings during this visit above 130 for the top number or above 80 for the bottom number, please have this repeated by your primary care provider within one month. --------------  Thank you for allowing [place] to participate in your care today.

## 2017-12-03 NOTE — ED Provider Notes (Signed)
[facility]-EMERGENCY DEPT Provider Note   CSN: [id number] Arrival date & time: 12/03/17  1213     History   Chief Complaint Chief Complaint  Patient presents with  . Seizures  . Migraine    HPI Shelia Walker is a 22 y.o. female.  HPI   Patient is a 22 year old female to female transgender patient (prefers pronouns they/them/their) presenting for transient episode of muscular stiffness.  Patient reports that they were preparing coffee for the roommates earlier today when they had sudden episode of crouching down, "shaking" and there bilateral upper extremities, and "shaking" progressing bilateral lower extremities.  This was symmetric and equal bilaterally.  Patient denies any numbness during this interval.  Patient reports that they were completely aware during this entire time, and there was no loss of consciousness.  This episode was witnessed by a friend, who accompanies the patient, who states that the patient was scratching, as if in pain.  Patient is also reporting transient shortness of breath during this 45-minute episode, but no pain.  Patient denies any blurred vision, double vision, trouble with speech or hearing, trouble swallowing, nausea, vomiting.  Patient reports that they first had an episode of transient awareness and "strokelike symptoms" 4 days ago, which was worked up.  Patient currently has a neurologist.  Patient is scheduled for upcoming EEG/EMG, and MRI/MRA.   Patient reports that subsequently after this episode, they developed a frontal headache, aching in character.  They describe it is similar to all prior headaches, which they have a history of.  Past Medical History:  Diagnosis Date  . Anxiety   . Depression   . Migraines   . PTSD (post-traumatic stress disorder)   . Transgender     Patient Active Problem List   Diagnosis Date Noted  . Positive ANA (antinuclear antibody) 11/07/2017  . Female-to-female transgender person 11/04/2017   . Severe episode of recurrent major depressive disorder, without psychotic features (HCC) 11/04/2017  . Myalgia 11/04/2017    Past Surgical History:  Procedure Laterality Date  . WISDOM TOOTH EXTRACTION       OB History   None      Home Medications    Prior to Admission medications   Medication Sig Start Date End Date Taking? Authorizing Provider  B-D 3CC LUER-LOK SYR 25GX1/2" 25G X 1-1/2" 3 ML MISC INJECT 1 ML Q OTHER WEEK INTO MUSCLE UTD 10/21/17   [provider]  BD DISP NEEDLES 22G X 1-1/2" MISC USE UTD 10/21/17   [provider]  FLUoxetine (PROZAC) 20 MG tablet Take 1 tablet (20 mg total) by mouth daily. 11/18/17   Nche, Bonna Gains, NP  naproxen sodium (ALEVE) 220 MG tablet Take 880 mg by mouth daily as needed (pain).    [provider]  phenazopyridine (PYRIDIUM) 200 MG tablet Take 1 tablet (200 mg total) by mouth 3 (three) times daily. Patient not taking: Reported on 11/04/2017 08/12/17   Wurst, [place], PA-C  testosterone cypionate (DEPOTESTOSTERONE CYPIONATE) 200 MG/ML injection Inject 200 mg into the muscle every 14 (fourteen) days.  10/21/17   [provider]    Family History Family History  Problem Relation Age of Onset  . Fibromyalgia Mother   . Cerebral aneurysm Mother   . Hyperlipidemia Mother   . Hypertension Mother   . Stroke Mother        x3, first stroke age 82  . Depression Mother   . Autoimmune disease Mother   . Depression Father   .  Fibromyalgia Maternal Grandmother   . Arthritis Maternal Grandmother   . Diabetes Maternal Grandmother   . Depression Maternal Grandmother   . Depression Sister   . Learning disabilities Sister   . Mental illness Sister   . Healthy Brother     Social History Social History   Tobacco Use  . Smoking status: Former Games developer  . Smokeless tobacco: Never Used  Substance Use Topics  . Alcohol use: Yes    Comment: social  . Drug use: Yes    Types: Marijuana    Comment: social       Allergies   Ginger and Pyridium [phenazopyridine hcl]   Review of Systems Review of Systems  Constitutional: Negative for chills and fever.  HENT: Negative for congestion and sore throat.   Eyes: Negative for visual disturbance.  Respiratory: Positive for shortness of breath. Negative for cough and chest tightness.   Cardiovascular: Negative for chest pain, palpitations and leg swelling.  Gastrointestinal: Negative for abdominal pain, nausea and vomiting.  Genitourinary: Negative for dysuria and flank pain.  Musculoskeletal: Negative for back pain and myalgias.  Skin: Negative for rash.  Neurological: Positive for tremors and headaches. Negative for dizziness, syncope, light-headedness and numbness.     Physical Exam Updated Vital Signs BP (!) 128/92 (BP Location: Right Arm)   Pulse 90   Temp 98.1 F (36.7 C) (Oral)   Resp 18   Ht 5\' 3"  (1.6 m)   Wt 72 kg   SpO2 96%   BMI 28.12 kg/m   Physical Exam  Constitutional: [name], place, and time. She appears well-developed and well-nourished. No distress.  HENT:  Head: Normocephalic and atraumatic.  Mouth/Throat: Oropharynx is clear and moist.  Eyes: Pupils are equal, round, and reactive to light. Conjunctivae and EOM are normal.  Neck: Normal range of motion. Neck supple.  Cardiovascular: Normal rate, regular rhythm, S1 normal and S2 normal.  No murmur heard. Pulmonary/Chest: Effort normal and breath sounds normal. She has no wheezes. She has no rales.  Abdominal: Soft. She exhibits no distension. There is no tenderness. There is no guarding.  Musculoskeletal: Normal range of motion. She exhibits no edema or deformity.  Neurological: She is alert and oriented to person, place, and time.  Mental Status:  Alert, oriented, thought content appropriate, able to give a coherent history. Speech fluent without evidence of aphasia. Able to follow 2 step commands without difficulty.  Cranial Nerves:  II:   Peripheral visual fields grossly normal, pupils equal, round, reactive to light III,IV, VI: ptosis not present, extra-ocular motions intact bilaterally  V,VII: smile symmetric, facial light touch sensation equal VIII: hearing grossly normal to voice  X: Uvula elevates symmetrically. Uvula deviates to the right.   XI: bilateral shoulder shrug symmetric and strong XII: midline tongue extension without fassiculations Motor:  Normal tone. 5/5 in upper and lower extremities bilaterally including strong and equal grip strength and dorsiflexion/plantar flexion Sensory: Pinprick and light touch normal in all extremities.  Deep Tendon Reflexes: 2+ and symmetric in the biceps and patella. Hyperreflexia on right. Cerebellar: normal finger-to-nose with bilateral upper extremities Gait: normal gait and balance.  Tandem walking, heel walking, and toe walking without ataxia or dysmetria. Stance: No pronator drift and good coordination, strength, and position sense with tapping of bilateral arms (performed in sitting position). CV: distal pulses palpable throughout    Skin: Skin is warm and dry. No rash noted. No erythema.  Psychiatric: She has a normal mood and affect.  Her behavior is normal. Judgment and thought content normal.  Nursing note and vitals reviewed.    ED Treatments / Results  Labs (all labs ordered are listed, but only abnormal results are displayed) Labs Reviewed - No data to display  EKG None  Radiology No results found.  Procedures Procedures (including critical care time)  Medications Ordered in ED Medications - No data to display   Initial Impression / Assessment and Plan / ED Course  I have reviewed the triage vital signs and the nursing notes.  Pertinent labs & imaging results that were available during my care of the patient were reviewed by me and considered in my medical decision making (see chart for details).  Clinical Course as of Dec 04 1830  Sat Dec 03, 2017  1540 Spoke with Dr. Karel Jarvis of [facility] neurology who recommends MRI/MRA of head. MRI ordered. Pt reassessed and feeling well.    [AM]  1813 Consistent with prior elevated hematocrit.  Hemoglobin(!): 16.6 [AM]  1814 Pt is not having any urinary or vaginal symptoms.   Urinalysis, Routine w reflex microscopic(!) [AM]  [id number] Patient has no headache at present.   [AM]    Clinical Course User Index [AM] Elisha Ponder, PA-C    Patient nontoxic-appearing, and back to neurologic baseline.  Patient in the process of work-up for seizure versus other neurologic episodes.  Do not suspect calibrate, as patient has no preceding infectious symptoms, and symptoms are descending and ascending.  Differential diagnosis also includes demyelinating lesions.  Will discuss with on-call provider for [facility] neurology.   Patient has increased hematocrit.  This is consistent with patient's testosterone use.  Patient also has pyuria.  Patient denies any dysuria, urgency, frequency, or discharge from [facility] holes, or malodorous discharge.  Patient is sexually active with a female partner.  I discussed with patient that if they are developed to develop any dysuria, urgency, frequency, or increased discharge, they should be reevaluated for UTI and STI.  Patient had negative pregnancy test 4 days ago. Denies menstruation in months due to testosterone and IUD.   Hemoglobin  Date Value Ref Range Status  12/03/2017 16.6 (H) 12.0 - 15.0 g/dL Final  16/11/9602 54.0 (H) 12.0 - 15.0 g/dL Final  98/12/9145 82.9 (H) 12.0 - 15.0 g/dL Final   Patient had MRI/MRI without acute abnormality.  Patient has upcoming EEG study in 2 days.  Patient was given return precautions for any further transient episodes of awareness or muscular rigidity, weakness or numbness, changes in speech, or any new or worsening symptoms.  Patient is in understanding and agrees with the plan of care.  This is a supervised visit with Dr. Derwood Kaplan.  Evaluation, management, and discharge planning discussed with this attending physician.  Final Clinical Impressions(s) / ED Diagnoses   Final diagnoses:  Tremor of both hands  Frontal headache  Elevated hematocrit    ED Discharge Orders    None       Elisha Ponder, PA-C 12/03/17 Luetta Nutting, MD 12/04/17 [phone]

## 2017-12-03 NOTE — ED Triage Notes (Signed)
Pt states that they had a seizure on Tuesday and this morning.  Pt states they were very shaky while getting coffee for friends, and had to crouch. Pt states that today's episode was worse than they other day. Pt states that they were also short of breath during the incident. Pt also states they have a migraine currently. Pt states during the episode, they were unable to put words together. Pt states that their hands and arms are stiff. NIHSS 0 in triage.

## 2017-12-03 NOTE — ED Notes (Signed)
Pt returned from MRI °

## 2017-12-04 LAB — CK

## 2017-12-04 LAB — TEST AUTHORIZATION

## 2017-12-04 LAB — MYOSITIS ASSESSR PLUS JO-1 AUTOABS
EJ Autoabs: NOT DETECTED
Jo-1 Autoabs: <1 AI
Ku Autoabs: NOT DETECTED
Mi-2 Autoabs: NOT DETECTED
OJ Autoabs: NOT DETECTED
PL-12 Autoabs: NOT DETECTED
PL-7 Autoabs: NOT DETECTED
SRP Autoabs: NOT DETECTED

## 2017-12-04 LAB — ALDOLASE: Aldolase: 10.4 U/L — ABNORMAL HIGH (ref <=8.1)

## 2017-12-05 ENCOUNTER — Telehealth: Payer: Self-pay | Admitting: *Deleted

## 2017-12-05 ENCOUNTER — Ambulatory Visit (INDEPENDENT_AMBULATORY_CARE_PROVIDER_SITE_OTHER): Payer: BLUE CROSS/BLUE SHIELD | Admitting: Neurology

## 2017-12-05 DIAGNOSIS — R748 Abnormal levels of other serum enzymes: Secondary | ICD-10-CM

## 2017-12-05 DIAGNOSIS — R404 Transient alteration of awareness: Secondary | ICD-10-CM | POA: Diagnosis not present

## 2017-12-05 NOTE — Telephone Encounter (Signed)
-----   Message from Pollyann Savoy, MD sent at 12/05/2017  1:03 PM EDT ----- Please notify patient that her aldolase is elevated as well.  Dr. Ricka Burdock is planning to do EMG and nerve conduction velocity.  I would like to schedule a muscle biopsy.

## 2017-12-05 NOTE — Procedures (Signed)
ELECTROENCEPHALOGRAM REPORT  Date of Study: 12/05/2017  Patient's Name: Shelia Walker MRN: [id number] Date of Birth: Jul 23, 1995  Clinical History: 23 year old transgender female with episode of altered awareness.  Medications: PROZAC 20 MG tablet  ALEVE 220 MG tablet  DEPOTESTOSTERONE CYPIONATE 200 MG/ML injection DESYREL 50 MG tablet  Technical Summary: A multichannel digital EEG recording measured by the international 10-20 system with electrodes applied with paste and impedances below 5000 ohms performed in our laboratory with EKG monitoring in an awake and asleep patient.  Hyperventilation performed.  Photic stimulation was not performed due to equipment malfunction.  The digital EEG was referentially recorded, reformatted, and digitally filtered in a variety of bipolar and referential montages for optimal display.    Description: The patient is awake and asleep during the recording.  During maximal wakefulness, there is a symmetric, medium voltage 11 Hz posterior dominant rhythm that attenuates with eye opening.  The record is symmetric.  During drowsiness and sleep, there is an increase in theta slowing of the background.  Vertex waves and symmetric sleep spindles were seen.  Hyperventilation did not elicit any abnormalities.  There were no epileptiform discharges or electrographic seizures seen.    EKG lead was unremarkable.  Impression: This awake and asleep EEG is normal.    Clinical Correlation: A normal EEG does not exclude a clinical diagnosis of epilepsy.  If further clinical questions remain, prolonged EEG may be helpful.  Clinical correlation is advised.   Shon Millet, DO

## 2017-12-05 NOTE — Progress Notes (Signed)
Please notify patient that her aldolase is elevated as well.  Dr. Ricka Burdock is planning to do EMG and nerve conduction velocity.  I would like to schedule a muscle biopsy.

## 2017-12-06 ENCOUNTER — Ambulatory Visit (INDEPENDENT_AMBULATORY_CARE_PROVIDER_SITE_OTHER): Payer: BLUE CROSS/BLUE SHIELD | Admitting: Neurology

## 2017-12-06 ENCOUNTER — Telehealth: Payer: Self-pay | Admitting: *Deleted

## 2017-12-06 ENCOUNTER — Encounter: Payer: Self-pay | Admitting: *Deleted

## 2017-12-06 DIAGNOSIS — M791 Myalgia, unspecified site: Secondary | ICD-10-CM

## 2017-12-06 NOTE — Procedures (Signed)
[facility]  [address] [place], [address]  [place], [place] [address] Tel: [phone] Fax:  [phone] Test Date:  12/06/2017  Patient: Shelia Walker DOB: [birth year] Physician: Nita Sickle, DO  Sex: Female Height: 5\' 3"  Ref Phys: Shon Millet, DO  ID#: [id number] Temp: 33.0C Technician:    Patient Complaints: This is a 22 year old transgender female referred for evaluation of generalized myalgias in the setting of elevated CK.  NCV & EMG Findings: Extensive electrodiagnostic testing of the right upper and lower extremities shows:  1. All sensory responses including the right median, ulnar, sural, and superficial peroneal nerves are within normal limits. 2. All motor responses including the right median, ulnar, peroneal, and tibial nerves are within normal limits. 3. Right tibial H reflex study is within normal limits. 4. There is no evidence of active or chronic motor axonal loss changes affecting any of the tested muscles.  Motor unit configuration and recruitment pattern is within normal limits.  Specifically, there is no evidence of small, complex, polyphasic motor units in any of the tested muscles.  Impression: This is a normal study of the right arm and leg.    In particular, there is no evidence of a diffuse myopathy, sensorimotor polyneuropathy, or cervical/lumbosacral radiculopathy.   ___________________________ Nita Sickle, DO    Nerve Conduction Studies Anti Sensory Summary Table   Site NR Peak (ms) Norm Peak (ms) P-T Amp (V) Norm P-T Amp  Right Median Anti Sensory (2nd Digit)  33C  Wrist    2.4 <3.3 111.0 >20  Right Sup Peroneal Anti Sensory (Ant Lat Mall)  33C  12 cm    1.9 <4.4 27.3 >6  Right Sural Anti Sensory (Lat Mall)  33C  Calf    2.7 <4.4 59.9 >6  Right Ulnar Anti Sensory (5th Digit)  33C  Wrist    2.1 <3.0 88.4 >18   Motor Summary Table   Site NR Onset (ms) Norm Onset (ms) O-P Amp (mV) Norm O-P Amp Site1 Site2 Delta-0 (ms) Dist  (cm) Vel (m/s) Norm Vel (m/s)  Right Median Motor (Abd Poll Brev)  33C  Wrist    2.1 <3.9 16.5 >6 Elbow Wrist 3.8 27.0 71 >51  Elbow    5.9  16.2         Right Peroneal Motor (Ext Dig Brev)  33C  Ankle    2.3 <5.5 6.5 >3 B Fib Ankle 5.9 38.0 64 >41  B Fib    8.2  6.4  Poplt B Fib 1.0 7.0 70 >41  Poplt    9.2  6.4         Right Peroneal TA Motor (Tib Ant)  33C  Fib Head    2.4 <4.0 7.2 >4 Poplit Fib Head 0.9 7.0 78 >41  Poplit    3.3  7.0         Right Tibial Motor (Abd Hall Brev)  33C  Ankle    1.8 <5.8 16.5 >8 Knee Ankle 7.1 37.0 52 >41  Knee    8.9  14.0         Right Ulnar Motor (Abd Dig Minimi)  33C  Wrist    1.6 <3.0 9.8 >8 B Elbow Wrist 3.1 20.0 65 >51  B Elbow    4.7  9.1  A Elbow B Elbow 1.6 10.0 63 >51  A Elbow    6.3  8.6          H Reflex Studies   NR H-Lat (ms) Lat  Norm (ms) L-R H-Lat (ms)  Right Tibial (Gastroc)  33C     27.07 <35    EMG   Side Muscle Ins Act Fibs Psw Fasc Number Recrt Dur Dur. Amp Amp. Poly Poly. Comment  Right 1stDorInt Nml Nml Nml Nml Nml Nml Nml Nml Nml Nml Nml Nml N/A  Right PronatorTeres Nml Nml Nml Nml Nml Nml Nml Nml Nml Nml Nml Nml N/A  Right Biceps Nml Nml Nml Nml Nml Nml Nml Nml Nml Nml Nml Nml N/A  Right Triceps Nml Nml Nml Nml Nml Nml Nml Nml Nml Nml Nml Nml N/A  Right Deltoid Nml Nml Nml Nml Nml Nml Nml Nml Nml Nml Nml Nml N/A  Right Cervical Parasp Low Nml Nml Nml Nml Nml Nml Nml Nml Nml Nml Nml Nml N/A  Right AntTibialis Nml Nml Nml Nml Nml Nml Nml Nml Nml Nml Nml Nml N/A  Right Gastroc Nml Nml Nml Nml Nml Nml Nml Nml Nml Nml Nml Nml N/A  Right RectFemoris Nml Nml Nml Nml Nml Nml Nml Nml Nml Nml Nml Nml N/A  Right GluteusMed Nml Nml Nml Nml Nml Nml Nml Nml Nml Nml Nml Nml N/A  Right Lumbo Parasp Low Nml Nml Nml Nml Nml Nml Nml Nml Nml Nml Nml Nml N/A      Waveforms:

## 2017-12-06 NOTE — Telephone Encounter (Signed)
Results sent via My Chart.  

## 2017-12-06 NOTE — Progress Notes (Signed)
We will schedule muscle biopsy. Thanks

## 2017-12-06 NOTE — Telephone Encounter (Signed)
-----   Message from Vivien Rota, LPN sent at 40/10/8117 11:48 AM EDT -----   ----- Message ----- From: Drema Dallas, DO Sent: 12/05/2017   1:43 PM EDT To: Dorthy Cooler, CMA  EEG is normal

## 2017-12-07 ENCOUNTER — Encounter (HOSPITAL_COMMUNITY): Payer: Self-pay

## 2017-12-07 ENCOUNTER — Emergency Department (HOSPITAL_COMMUNITY)
Admission: EM | Admit: 2017-12-07 | Discharge: 2017-12-07 | Disposition: A | Discharge status: Home / Self Care | Payer: BLUE CROSS/BLUE SHIELD | Attending: Emergency Medicine | Admitting: Emergency Medicine

## 2017-12-07 ENCOUNTER — Other Ambulatory Visit: Payer: Self-pay

## 2017-12-07 DIAGNOSIS — R569 Unspecified convulsions: Secondary | ICD-10-CM

## 2017-12-07 DIAGNOSIS — R251 Tremor, unspecified: Secondary | ICD-10-CM

## 2017-12-07 MED ORDER — LORAZEPAM 1 MG PO TABS: 1.0000 mg | ORAL_TABLET | Freq: Three times a day (TID) | ORAL | 0 refills | Status: DC | PRN

## 2017-12-07 NOTE — ED Triage Notes (Signed)
Pt reports "seizure like activity." The patient describes this as bilateral leg and arm weakness and contractures. Pt states that hands are stuck in a contracted position. A&Ox4. Ambulatory. Denies any change in consciousness. Seen recently for same.

## 2017-12-07 NOTE — ED Provider Notes (Signed)
[facility]-EMERGENCY DEPT Provider Note   CSN: [id number] Arrival date & time: 12/07/17  1600     History   Chief Complaint Chief Complaint  Patient presents with  . Contractures    HPI Shelia Walker is a 22 y.o. female.  Pt presents to the ED today with "seizure like activity."  The pt is transitioning from female to female and prefers the pronoun they.  The pt said they have had several episodes like this recently.  The pt has been here twice for the same.  They have also seen rheumatology and neurology.  They have had a normal EEG, EMG, MRI, and MRA.  Episode happened again today while in ballet class.  They did not have any change in consciousness, just felt shaky and stiff.  Labs last done on 10/5 reassuring.  Mild pyuria, but no uti sx.  Pt is feeling much better now.  Pt said they came back here because she was told to by her doctor.     Past Medical History:  Diagnosis Date  . Anxiety   . Depression   . Migraines   . PTSD (post-traumatic stress disorder)   . Transgender     Patient Active Problem List   Diagnosis Date Noted  . Positive ANA (antinuclear antibody) 11/07/2017  . 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 Severe episode of recurrent major depressive disorder, without psychotic features (HCC) 11/04/2017  . Myalgia 11/04/2017    Past Surgical History:  Procedure Laterality Date  . WISDOM TOOTH EXTRACTION       OB History   None      Home Medications    Prior to Admission medications   Medication Sig Start Date End Date Taking? Authorizing Provider  FLUoxetine (PROZAC) 20 MG tablet Take 1 tablet (20 mg total) by mouth daily. 11/18/17  Yes Nche, Bonna Gains, NP  naproxen sodium (ALEVE) 220 MG tablet Take 880 mg by mouth daily as needed (pain).   Yes [provider]  testosterone cypionate (DEPOTESTOSTERONE CYPIONATE) 200 MG/ML injection Inject 200 mg into the muscle every 14 (fourteen) days.  10/21/17  Yes  [provider]  B-D 3CC LUER-LOK SYR 25GX1/2" 25G X 1-1/2" 3 ML MISC INJECT 1 ML Q OTHER WEEK INTO MUSCLE UTD 10/21/17   [provider]  BD DISP NEEDLES 22G X 1-1/2" MISC USE UTD 10/21/17   [provider]  phenazopyridine (PYRIDIUM) 200 MG tablet Take 1 tablet (200 mg total) by mouth 3 (three) times daily. Patient not taking: Reported on 11/04/2017 08/12/17   Rennis Harding, PA-C    Family History Family History  Problem Relation Age of Onset  . Fibromyalgia Mother   . Cerebral aneurysm Mother   . Hyperlipidemia Mother   . Hypertension Mother   . Stroke Mother        x3, first stroke age 33  . Depression Mother   . Autoimmune disease Mother   . Depression Father   . Fibromyalgia Maternal Grandmother   . Arthritis Maternal Grandmother   . Diabetes Maternal Grandmother   . Depression Maternal Grandmother   . Depression Sister   . Learning disabilities Sister   . Mental illness Sister   . Healthy Brother     Social History Social History   Tobacco Use  . Smoking status: Former Games developer  . Smokeless tobacco: Never Used  Substance Use Topics  . Alcohol use: Yes    Comment: social  . Drug use: Yes    Types:  Marijuana    Comment: social     Allergies   Ginger and Pyridium [phenazopyridine hcl]   Review of Systems Review of Systems  Neurological: Positive for tremors, weakness and headaches.  All other systems reviewed and are negative.    Physical Exam Updated Vital Signs BP (!) 139/98 (BP Location: Right Arm)   Pulse (!) 105   Temp 98.6 F (37 C) (Oral)   Resp 15   SpO2 98%   Physical Exam  Constitutional: [name], place, and time. She appears well-developed and well-nourished.  HENT:  Head: Normocephalic and atraumatic.  Right Ear: External ear normal.  Left Ear: External ear normal.  Nose: Nose normal.  Mouth/Throat: Oropharynx is clear and moist.  Eyes: Pupils are equal, round, and reactive to light.  Conjunctivae and EOM are normal.  Neck: Normal range of motion. Neck supple.  Cardiovascular: Normal rate, regular rhythm, normal heart sounds and intact distal pulses.  Pulmonary/Chest: Effort normal and breath sounds normal.  Abdominal: Soft. Bowel sounds are normal.  Musculoskeletal: Normal range of motion.  Neurological: She is alert and oriented to person, place, and time.  Skin: Skin is warm. Capillary refill takes less than 2 seconds.  Psychiatric: She has a normal mood and affect. Her behavior is normal. Judgment and thought content normal.  Nursing note and vitals reviewed.    ED Treatments / Results  Labs (all labs ordered are listed, but only abnormal results are displayed) Labs Reviewed - No data to display  EKG None  Radiology No results found.  Procedures Procedures (including critical care time)  Medications Ordered in ED Medications - No data to display   Initial Impression / Assessment and Plan / ED Course  I have reviewed the triage vital signs and the nursing notes.  Pertinent labs & imaging results that were available during my care of the patient were reviewed by me and considered in my medical decision making (see chart for details).    At this time, pt has had all the work up we can do in the ED.  Sx may be anxiety/hormones related as their hormones are changing and they are changing genders.  Pt willing to try a short course of ativan.   Pt will need to f/u with neurology.  Return if worse.  Final Clinical Impressions(s) / ED Diagnoses   Final diagnoses:  Occasional tremors  Seizure-like activity [facility])    ED Discharge Orders    None       Jacalyn Lefevre, MD 12/07/17 [phone]

## 2017-12-08 ENCOUNTER — Other Ambulatory Visit: Payer: Self-pay

## 2017-12-08 ENCOUNTER — Telehealth: Payer: Self-pay

## 2017-12-08 DIAGNOSIS — R404 Transient alteration of awareness: Secondary | ICD-10-CM

## 2017-12-08 DIAGNOSIS — R569 Unspecified convulsions: Secondary | ICD-10-CM

## 2017-12-08 NOTE — Telephone Encounter (Signed)
Orders placed for AMBULATORY EEG.  Staff message sent to Ala Bent for scheduling.

## 2017-12-08 NOTE — Telephone Encounter (Signed)
-----   Message from Drema Dallas, DO sent at 12/08/2017  6:11 AM EDT ----- Shelia Walker, This patient that I have seen was in the ED with "seizure-like episode".  Already had routine EEG.  Would like to order 72 hour ambulatory EEG and should make a follow up appointment afterward.

## 2017-12-14 DIAGNOSIS — F640 Transsexualism: Secondary | ICD-10-CM | POA: Diagnosis not present

## 2017-12-15 ENCOUNTER — Telehealth: Payer: Self-pay | Admitting: Neurology

## 2017-12-15 NOTE — Telephone Encounter (Signed)
Patient called and left a voicemail wanting to check the status of a referral for an EEG. She wasn't sure if this was still the plan as she hasn't heard anything about it. Please call her back at [phone]. Thanks!

## 2017-12-15 NOTE — Telephone Encounter (Signed)
Ala Bent called Pt, LMOVM, offered her Monday 10/28, Monday 11/18. This is in addition to one other message the Pt has already been left to call to schedule.

## 2017-12-19 ENCOUNTER — Encounter: Payer: Self-pay | Admitting: Physician Assistant

## 2017-12-19 ENCOUNTER — Telehealth: Payer: Self-pay | Admitting: Rheumatology

## 2017-12-19 ENCOUNTER — Ambulatory Visit: Payer: BLUE CROSS/BLUE SHIELD | Admitting: Rheumatology

## 2017-12-19 VITALS — BP 113/73 | HR 94 | Resp 12 | Ht 63.0 in | Wt 160.2 lb

## 2017-12-19 DIAGNOSIS — Z789 Other specified health status: Secondary | ICD-10-CM

## 2017-12-19 DIAGNOSIS — M791 Myalgia, unspecified site: Secondary | ICD-10-CM

## 2017-12-19 DIAGNOSIS — F640 Transsexualism: Secondary | ICD-10-CM

## 2017-12-19 DIAGNOSIS — R768 Other specified abnormal immunological findings in serum: Secondary | ICD-10-CM

## 2017-12-19 DIAGNOSIS — F332 Major depressive disorder, recurrent severe without psychotic features: Secondary | ICD-10-CM | POA: Diagnosis not present

## 2017-12-19 NOTE — Progress Notes (Signed)
Office Visit Note  Patient: Shelia Walker             Date of Birth: 06-Mar-1995           MRN: [id number]             PCP: Flossie Buffy, NP Referring: Flossie Buffy, NP Visit Date: 12/19/2017 Occupation: _0 @  Subjective:  Muscle pain and fatigue.   History of Present Illness: Skylee Baird is a 22 y.o. female with history of myalgias.  According to Shelia Walker she has not taken any testosterone injections in the last 3 weeks.  The testosterone injections are intramuscular.  Patient was seen by neurologist and had thorough work-up including nerve conduction velocity and EMG which was normal.  TJ experiences fatigue and muscle pain which has not changed since the last visit.  She describes pain in her cervical spine, thoracic and lumbar spine.  She is in so much discomfort that she has been using a cane to walk.  Activities of Daily Living:  Patient reports morning stiffness for 1-2 hours.   Patient Reports nocturnal pain.  Difficulty dressing/grooming: Reports Difficulty climbing stairs: Reports Difficulty getting out of chair: Reports Difficulty using hands for taps, buttons, cutlery, and/or writing: Reports  Review of Systems  Constitutional: Positive for fatigue.  HENT: Positive for mouth dryness. Negative for mouth sores, trouble swallowing and trouble swallowing.   Eyes: Positive for dryness. Negative for pain, redness and itching.  Respiratory: Negative for shortness of breath, wheezing and difficulty breathing.   Cardiovascular: Negative for chest pain, palpitations and swelling in legs/feet.  Gastrointestinal: Positive for constipation. Negative for abdominal pain, diarrhea, nausea and vomiting.  Endocrine: Negative for increased urination.  Genitourinary: Negative for painful urination, nocturia and pelvic pain.  Musculoskeletal: Positive for arthralgias, joint pain and morning stiffness.  Skin: Negative for rash and hair loss.  Allergic/Immunologic: Negative  for susceptible to infections.  Neurological: Positive for light-headedness, headaches, memory loss and weakness. Negative for dizziness.  Hematological: Negative for bruising/bleeding tendency.  Psychiatric/Behavioral: Negative for confusion. The patient is not nervous/anxious.     PMFS History:  Patient Active Problem List   Diagnosis Date Noted  . Positive ANA (antinuclear antibody) 11/07/2017  . 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 Severe episode of recurrent major depressive disorder, without psychotic features ([place]) 11/04/2017  . Myalgia 11/04/2017    Past Medical History:  Diagnosis Date  . Anxiety   . Depression   . Migraines   . PTSD (post-traumatic stress disorder)   . Transgender     Family History  Problem Relation Age of Onset  . Fibromyalgia Mother   . Cerebral aneurysm Mother   . Hyperlipidemia Mother   . Hypertension Mother   . Stroke Mother        x3, first stroke age 74, third stroke 2013  . Depression Mother   . Autoimmune disease Mother   . Depression Father   . Fibromyalgia Maternal Grandmother   . Arthritis Maternal Grandmother   . Diabetes Maternal Grandmother   . Depression Maternal Grandmother   . Depression Sister   . Learning disabilities Sister   . Mental illness Sister   . Healthy Brother    Past Surgical History:  Procedure Laterality Date  . WISDOM TOOTH EXTRACTION     Social History   Social History Narrative   Patient is right-handed. The patient lives with her same sex partner and 2 other roommates in a 2 story house. She occasionally drinks coffee  and tea, and has a soda 3-4 x a week. She does not exercise    Objective: Vital Signs: BP 113/73 (BP Location: Left Arm, Patient Position: Sitting, Cuff Size: Normal)   Pulse 94   Resp 12   Ht _0  (1.6 m)   Wt 160 lb 3.2 oz (72.7 kg)   BMI 28.38 kg/m    Physical Exam  Constitutional: [name], place, and time. She appears well-developed and  well-nourished.  Patient has been using a cane to walk.  She has difficulty with mobility due to muscle pain.  HENT:  Head: Normocephalic and atraumatic.  Eyes: Conjunctivae and EOM are normal.  Neck: Normal range of motion.  Cardiovascular: Normal rate, regular rhythm, normal heart sounds and intact distal pulses.  Pulmonary/Chest: Effort normal and breath sounds normal.  Abdominal: Soft. Bowel sounds are normal.  Lymphadenopathy:    She has no cervical adenopathy.  Neurological: She is alert and oriented to person, place, and time.  Skin: Skin is warm and dry. Capillary refill takes less than 2 seconds.  Psychiatric: She has a normal mood and affect. Her behavior is normal.  Nursing note and vitals reviewed.    Musculoskeletal Exam: C-spine thoracic lumbar spine good range of motion with discomfort.  Shoulder joints elbow joints wrist joint MCPs PIPs DIPs were in good range of motion with no synovitis.  Hip joints knee joints ankles MTPs PIPs DIPs were in good range of motion with no synovitis.  She had good muscle strength in all 4 extremities but had discomfort which causes problems with mobility.  CDAI Exam: CDAI Score: Not documented Patient Global Assessment: Not documented; Provider Global Assessment: Not documented Swollen: Not documented; Tender: Not documented Joint Exam   Not documented   There is currently no information documented on the homunculus. Go to the Rheumatology activity and complete the homunculus joint exam.  Investigation: No additional findings. December 03, 2017 UA showed 30 mg/dL protein, moderate leukocytes and rare bacteria, November 21, 2017 SPEP showed isolated elevation of alpha-2 globulin, myositis assessment panel negative, ESR 2, CK 536, aldolase 10.4, RF negative, anti-CCP negative, G6PD normal, AVISE lupus index -0.2, ANA 1: 640 speckled, ENA negative, CB CAP negative, beta-2 negative, anticardiolipin negative, RF negative, anti-CCP negative,  antithyroglobulin negative, antithyroid peroxidase negative Imaging: Ct Head Wo Contrast  Result Date: 11/29/2017 CLINICAL DATA:  Subacute neuro deficits EXAM: CT HEAD WITHOUT CONTRAST TECHNIQUE: Contiguous axial images were obtained from the base of the skull through the vertex without intravenous contrast. COMPARISON:  None. FINDINGS: Brain: The ventricular system is normal in size and configuration and the septum is in a normal midline position. The fourth ventricle and basilar cisterns are unremarkable. No hemorrhage, mass lesion, or acute infarction is seen. Vascular: No vascular abnormality is seen on this unenhanced study. Skull: On bone window images, no calvarial abnormality is seen. Sinuses/Orbits: The paranasal sinuses are well pneumatized Other: None IMPRESSION: .  Negative unenhanced CT of the brain Electronically Signed   By: Ivar Drape M.D.   On: 11/29/2017 13:54   Mr Jodene Nam Head Wo Contrast  Result Date: 12/03/2017 CLINICAL DATA:  Transient left eye blurry vision. Difficulty speaking. EXAM: MRI HEAD WITHOUT CONTRAST MRA HEAD WITHOUT CONTRAST TECHNIQUE: Multiplanar, multiecho pulse sequences of the brain and surrounding structures were obtained without intravenous contrast. Angiographic images of the head were obtained using MRA technique without contrast. COMPARISON:  Head CT 11/29/2017 FINDINGS: MRI HEAD FINDINGS The study is mildly motion degraded. Brain:  There is no evidence of acute infarct, intracranial hemorrhage, mass, midline shift, or extra-axial fluid collection. The ventricles and sulci are normal. The brain is normal in signal. Vascular: Major intracranial vascular flow voids are preserved. Skull and upper cervical spine: Unremarkable bone marrow signal. Sinuses/Orbits: Unremarkable orbits. Paranasal sinuses and mastoid air cells are clear. Other: None. MRA HEAD FINDINGS The visualized distal vertebral arteries are widely patent to the basilar with the left being slightly larger  than the right. Posterior inferior and superior cerebellar arteries are grossly patent though not well evaluated due to their small size. The basilar artery is widely patent. Posterior communicating arteries are not identified and may be diminutive or absent. PCAs are patent without evidence of significant stenosis. The internal carotid arteries are widely patent from skull base to carotid termini. ACAs and MCAs are patent without evidence of proximal branch occlusion or significant proximal stenosis. IMPRESSION: 1. Negative brain MRI within limitations of mild motion artifact. 2. Negative head MRA. Electronically Signed   By: Logan Bores M.D.   On: 12/03/2017 17:54   Mr Brain Wo Contrast  Result Date: 12/03/2017 CLINICAL DATA:  Transient left eye blurry vision. Difficulty speaking. EXAM: MRI HEAD WITHOUT CONTRAST MRA HEAD WITHOUT CONTRAST TECHNIQUE: Multiplanar, multiecho pulse sequences of the brain and surrounding structures were obtained without intravenous contrast. Angiographic images of the head were obtained using MRA technique without contrast. COMPARISON:  Head CT 11/29/2017 FINDINGS: MRI HEAD FINDINGS The study is mildly motion degraded. Brain: There is no evidence of acute infarct, intracranial hemorrhage, mass, midline shift, or extra-axial fluid collection. The ventricles and sulci are normal. The brain is normal in signal. Vascular: Major intracranial vascular flow voids are preserved. Skull and upper cervical spine: Unremarkable bone marrow signal. Sinuses/Orbits: Unremarkable orbits. Paranasal sinuses and mastoid air cells are clear. Other: None. MRA HEAD FINDINGS The visualized distal vertebral arteries are widely patent to the basilar with the left being slightly larger than the right. Posterior inferior and superior cerebellar arteries are grossly patent though not well evaluated due to their small size. The basilar artery is widely patent. Posterior communicating arteries are not identified  and may be diminutive or absent. PCAs are patent without evidence of significant stenosis. The internal carotid arteries are widely patent from skull base to carotid termini. ACAs and MCAs are patent without evidence of proximal branch occlusion or significant proximal stenosis. IMPRESSION: 1. Negative brain MRI within limitations of mild motion artifact. 2. Negative head MRA. Electronically Signed   By: Logan Bores M.D.   On: 12/03/2017 17:54   Xr Knee 3 View Left  Result Date: 11/21/2017 No knee joint narrowing was noted.  No chondrocalcinosis was noted.  No patellofemoral narrowing was noted. Impression: Unremarkable x-ray of the knee joint.  Xr Knee 3 View Right  Result Date: 11/21/2017 No knee joint narrowing was noted.  No chondrocalcinosis was noted.  No patellofemoral narrowing was noted. Impression: Unremarkable x-ray of the knee joint.   Recent Labs: Lab Results  Component Value Date   WBC 11.4 (H) 12/03/2017   HGB 16.6 (H) 12/03/2017   PLT 405 (H) 12/03/2017   NA 142 12/03/2017   K 4.0 12/03/2017   CL 103 12/03/2017   CO2 28 12/03/2017   GLUCOSE 90 12/03/2017   BUN 14 12/03/2017   CREATININE 0.86 12/03/2017   BILITOT 0.8 12/03/2017   ALKPHOS 73 12/03/2017   AST 28 12/03/2017   ALT 43 12/03/2017   PROT 8.9 (H) 12/03/2017   ALBUMIN  4.9 12/03/2017   CALCIUM 9.8 12/03/2017   GFRAA >60 12/03/2017    Speciality Comments: No specialty comments available.  Procedures:  No procedures performed Allergies: Ginger and Pyridium [phenazopyridine hcl]   Assessment / Plan:     Visit Diagnoses: Myalgia - Elevated CK and aldolase.  EMG and nerve conduction velocity was normal by neurologist.  Myositis panel is negative.  Patient has no muscular weakness but has myalgias.  I will repeat CK and check vitamin D levels today.  Patient states that she has been using intramuscular testosterone which she stopped using 3 weeks ago.  She also uses marijuana which she has not used in 1  month.  She did denies use of any other drug use.  Muscle biopsies pending at this time.  Also advised patient to make a follow-up appointment with Dr. Tomi Likens.  Positive ANA (antinuclear antibody)-ANA was positive but she has no clinical features of autoimmune disease.  ENA was negative.AVISE labs were discussed at length.  Female-to-female transgender person  Severe episode of recurrent major depressive disorder, without psychotic features ([place]) -patient is on medications.  Orders: Orders Placed This Encounter  Procedures  . CK  . VITAMIN D 25 Hydroxy (Vit-D Deficiency, Fractures)   No orders of the defined types were placed in this encounter.   Face-to-face time spent with patient was 30 minutes. Greater than 50% of time was spent in counseling and coordination of care.  Follow-Up Instructions: Return in about 4 weeks (around 01/16/2018) for Elevated CK.   Bo Merino, MD  Note - This record has been created using Editor, commissioning.  Chart creation errors have been sought, but may not always  have been located. Such creation errors do not reflect on  the standard of medical care.

## 2017-12-19 NOTE — Telephone Encounter (Signed)
Patient calling in regards to muscle biopsy. Patient thinks [facility] has already scheduled patient for one. Does doctor want patient to hold off on that until we get results? Please call patient to inform.

## 2017-12-20 ENCOUNTER — Other Ambulatory Visit: Payer: Self-pay

## 2017-12-20 DIAGNOSIS — E559 Vitamin D deficiency, unspecified: Secondary | ICD-10-CM

## 2017-12-20 DIAGNOSIS — M6281 Muscle weakness (generalized): Secondary | ICD-10-CM

## 2017-12-20 DIAGNOSIS — R748 Abnormal levels of other serum enzymes: Secondary | ICD-10-CM

## 2017-12-20 LAB — CK: Total CK: 267 U/L — ABNORMAL HIGH (ref 29–143)

## 2017-12-20 LAB — VITAMIN D 25 HYDROXY (VIT D DEFICIENCY, FRACTURES): Vit D, 25-Hydroxy: 22 ng/mL — ABNORMAL LOW (ref 30–100)

## 2017-12-20 MED ORDER — VITAMIN D (ERGOCALCIFEROL) 1.25 MG (50000 UNIT) PO CAPS: 50000.0000 [IU] | ORAL_CAPSULE | ORAL | 0 refills | Status: DC

## 2017-12-20 NOTE — Progress Notes (Signed)
NEUROLOGY FOLLOW UP OFFICE NOTE  Shelia Walker [id number]  HISTORY OF PRESENT ILLNESS: Shelia Walker is a 22 year old right-handed female-to-female transgender person (prefers pronoun "they") with depression, anxiety, PTSD, and migraines who follows up for altered mental status and myalgias.  UPDATE: I Episodes of Altered Mental Status and Seizure-like Activity: MRI/MRA of head without contrast from 12/03/17 personally reviewed and were normal.  Sleep-deprived EEG from 12/05/17 was normal.  She was seen in the ED on 12/03/17 and 12/07/17.  The first episode involved body stiffening with bilateral upper extremity shaking that spread to the lower extremities, with no loss of consciousness or awareness.  She appeared to be scratching herself and was short of breath.  Episode lasted 45 minutes.  There has been a total of 6 events.  Last one was last night (shaking and cannot move, no loss of awareness).  At the ED, the patient was prescribed Ativan, which helps.    II Myalgias/Weakness: They continue to have fatigue with neck and back pain.  Using a cane.  Labs from 11/22/17 revealed negative myositis autoantibody panel (PL-7, PL-12, Mi-2, Ku, EJ, OJ, SRP, Jo-1) was negative.  NCV-EMG from 12/05/17 was normal with no evidence of myopathy.  Magnesium was 2..  Vitamin D was low at 22, so she was recommended to take supplement.  Repeat CK from 12/19/17 was 267.  Due to trending down CK, muscle biopsy was cancelled.  She stopped intramuscular testosterone 3 weeks ago.    HISTORY: I  Altered Mental Status: Patient presented to the ED on 11/29/17.  He was in class when they suddenly started feeling his habitual panic attack, they had trouble breathing, patient was shaky in the hands.  They then developed decreased vision in the left eye, had difficulty forming words, pain in back and calves,  unable to move and feeling confused (trouble remembering conversations).  Episode was witnessed by their partner.  EMS was  called.  They did not lose consciousness.  No associated tongue biting or urinary or bowel incontinence.  Symptoms lasted about 30 minutes and resolved while in the ambulance.  They has never had a similar event.  CT of head was personally reviewed and was unremarkable.  There were no electrolyte abnormalities.  UA did reveal small leukocytes and rare bacteria.  Troponin was negative.  As per ED note, exam was noted for unilateral sluggish pupil and uvula deviation.  Otherwise, exam unremarkable.  Possible seizure was suggested.  Since then, they still feels shaky.  Patient is having more difficulty holding a pen.  In class, they had trouble with recalling information from class.  II  Myalgias & Muscle Weakness: Patient was also evaluated by rheumatology for myalgias, arthralgias, muscle weakness and fatigue since February 2019.  He had a positive ANA 1:640 speckled, sed rate 32, TSH 1.80, negative RF.  ENA was negative.  CK from 11/21/17 was 536 and aldolase 10.4. She had been taking intramuscular testosterone.  Family history noted that patient's mother has had 3 strokes.  The first stroke was at age 40.  She has had 2 other strokes, the last one in 2015.  Mother also has a cerebral aneurysm.  Patient has no other family history of aneurysms.  PAST MEDICAL HISTORY: Past Medical History:  Diagnosis Date  . Anxiety   . Depression   . Migraines   . PTSD (post-traumatic stress disorder)   . Transgender     MEDICATIONS: Current Outpatient Medications on File Prior to Visit  Medication Sig Dispense Refill  . B-D 3CC LUER-LOK SYR 25GX1/2" 25G X 1-1/2" 3 ML MISC INJECT 1 ML Q OTHER WEEK INTO MUSCLE UTD  1  . BD DISP NEEDLES 22G X 1-1/2" MISC USE UTD  1  . FLUoxetine (PROZAC) 20 MG tablet Take 1 tablet (20 mg total) by mouth daily. 90 tablet 1  . LORazepam (ATIVAN) 1 MG tablet Take 1 tablet (1 mg total) by mouth 3 (three) times daily as needed for anxiety. 15 tablet 0  . naproxen sodium (ALEVE) 220  MG tablet Take 880 mg by mouth daily as needed (pain).    Marland Kitchen testosterone cypionate (DEPOTESTOSTERONE CYPIONATE) 200 MG/ML injection Inject 200 mg into the muscle every 14 (fourteen) days.   0  . Vitamin D, Ergocalciferol, (DRISDOL) 50000 units CAPS capsule Take 1 capsule (50,000 Units total) by mouth every 7 (seven) days. 12 capsule 0   No current facility-administered medications on file prior to visit.     ALLERGIES: Allergies  Allergen Reactions  . Ginger Anaphylaxis  . Pyridium [Phenazopyridine Hcl] Nausea And Vomiting    headache    FAMILY HISTORY: Family History  Problem Relation Age of Onset  . Fibromyalgia Mother   . Cerebral aneurysm Mother   . Hyperlipidemia Mother   . Hypertension Mother   . Stroke Mother        x3, first stroke age 50, third stroke 2013  . Depression Mother   . Autoimmune disease Mother   . Depression Father   . Fibromyalgia Maternal Grandmother   . Arthritis Maternal Grandmother   . Diabetes Maternal Grandmother   . Depression Maternal Grandmother   . Depression Sister   . Learning disabilities Sister   . Mental illness Sister   . Healthy Brother    SOCIAL HISTORY: Social History   Socioeconomic History  . Marital status: Single    Spouse name: Not on file  . Number of children: Not on file  . Years of education: Not on file  . Highest education level: Some college, no degree  Occupational History  . Occupation: Consulting civil engineer  Social Needs  . Financial resource strain: Not on file  . Food insecurity:    Worry: Not on file    Inability: Not on file  . Transportation needs:    Medical: Not on file    Non-medical: Not on file  Tobacco Use  . Smoking status: Former Games developer  . Smokeless tobacco: Never Used  Substance and Sexual Activity  . Alcohol use: Not Currently    Comment: social  . Drug use: Not Currently    Types: Marijuana    Comment: social  . Sexual activity: Not on file  Lifestyle  . Physical activity:    Days per week:  Not on file    Minutes per session: Not on file  . Stress: Not on file  Relationships  . Social connections:    Talks on phone: Not on file    Gets together: Not on file    Attends religious service: Not on file    Active member of club or organization: Not on file    Attends meetings of clubs or organizations: Not on file    Relationship status: Not on file  . Intimate partner violence:    Fear of current or ex partner: Not on file    Emotionally abused: Not on file    Physically abused: Not on file    Forced sexual activity: Not on file  Other Topics  Concern  . Not on file  Social History Narrative   Patient is right-handed. The patient lives with her same sex partner and 2 other roommates in a 2 story house. She occasionally drinks coffee and tea, and has a soda 3-4 x a week. She does not exercise    REVIEW OF SYSTEMS: Constitutional: No fevers, chills, or sweats, no generalized fatigue, change in appetite Eyes: No visual changes, double vision, eye pain Ear, nose and throat: No hearing loss, ear pain, nasal congestion, sore throat Cardiovascular: No chest pain, palpitations Respiratory:  No shortness of breath at rest or with exertion, wheezes GastrointestinaI: No nausea, vomiting, diarrhea, abdominal pain, fecal incontinence Genitourinary:  No dysuria, urinary retention or frequency Musculoskeletal:  Arthralgias, myalgias Integumentary: No rash, pruritus, skin lesions Neurological: as above Psychiatric: depression, anxiety Endocrine: No palpitations, fatigue, diaphoresis, mood swings, change in appetite, change in weight, increased thirst Hematologic/Lymphatic:  No purpura, petechiae. Allergic/Immunologic: no itchy/runny eyes, nasal congestion, recent allergic reactions, rashes  PHYSICAL EXAM: Blood pressure 106/72, pulse 82, height 5\' 3"  (1.6 m), weight 158 lb (71.7 kg), SpO2 97 %. General: No acute distress.  Patient appears well-groomed.   Head:   Normocephalic/atraumatic Eyes:  Fundi examined but not visualized Neck: supple, no paraspinal tenderness, full range of motion Heart:  Regular rate and rhythm Lungs:  Clear to auscultation bilaterally Back: No paraspinal tenderness Neurological Exam: alert and oriented to person, place, and time. Attention span and concentration intact, recent and remote memory intact, fund of knowledge intact.  Speech fluent and not dysarthric, language intact.  CN II-XII intact. Bulk and tone normal, muscle strength 5/5 throughout.  Sensation to light touch, temperature and vibration intact.  Deep tendon reflexes 2+ throughout, toes downgoing.  Finger to nose and heel to shin testing intact.  Gait normal, Romberg negative.  IMPRESSION: 1.  Myalgias/elevated CK.  With normal EMG and lack of objective weakness, myopathy is unlikely.  CK has been trending down.  Question if related to testosterone. 2.  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  Semiology not consistent with seizure.    PLAN: 1.  Scheduled next week for 72 hour ambulatory EEG in order to capture an event. 2.  Repeat CK as per rheumatology 3.  Follow up on 11/15.  21 minutes spent face to face with patient, over 50% spent discussing diagnosis and management   Shon Millet, DO  CC:

## 2017-12-20 NOTE — Progress Notes (Signed)
Patient CK has improved.  She stopped testosterone injections a month ago.  I will hold off muscle biopsy and repeat CK in a month.  Patient was having difficulty walking and was using a cane yesterday.  Please advise her to discuss the muscle weakness with the neurologist.  She is to schedule a follow-up appointment.  Her vitamin D is low.  Please call in vitamin D 50,000 units once a week for 3 months and repeat vitamin D level after 3 months.

## 2017-12-20 NOTE — Telephone Encounter (Signed)
Advised patient of lab results and recommendations, patient verbalized understanding. See lab note. Patient is to hold off on muscle biopsy right now.

## 2017-12-21 ENCOUNTER — Ambulatory Visit: Payer: BLUE CROSS/BLUE SHIELD | Admitting: Neurology

## 2017-12-21 ENCOUNTER — Encounter: Payer: Self-pay | Admitting: Neurology

## 2017-12-21 VITALS — BP 106/72 | HR 82 | Ht 63.0 in | Wt 158.0 lb

## 2017-12-21 DIAGNOSIS — M791 Myalgia, unspecified site: Secondary | ICD-10-CM | POA: Diagnosis not present

## 2017-12-21 DIAGNOSIS — R569 Unspecified convulsions: Secondary | ICD-10-CM | POA: Diagnosis not present

## 2017-12-21 NOTE — Patient Instructions (Signed)
1.  You do not have a myopathy or primary muscle disease.  Elevated muscle enzymes may be elevated for different reasons.  Possibly related to the testosterone.  Sometimes there is no clinical reason. 2.  Return to set up the ambulatory EEG.  Follow up afterwards.

## 2017-12-23 ENCOUNTER — Ambulatory Visit: Payer: BLUE CROSS/BLUE SHIELD | Admitting: Nurse Practitioner

## 2017-12-26 ENCOUNTER — Ambulatory Visit (INDEPENDENT_AMBULATORY_CARE_PROVIDER_SITE_OTHER): Payer: BLUE CROSS/BLUE SHIELD | Admitting: Neurology

## 2017-12-26 DIAGNOSIS — R404 Transient alteration of awareness: Secondary | ICD-10-CM | POA: Diagnosis not present

## 2017-12-26 DIAGNOSIS — R569 Unspecified convulsions: Secondary | ICD-10-CM

## 2017-12-27 ENCOUNTER — Telehealth: Payer: Self-pay

## 2017-12-27 NOTE — Telephone Encounter (Signed)
Pt called wanting to know since she had a seizure last night, was it necessary for her to continue the EEG. I advised her yes, it would be beneficial to record as many events as possible. She wanted to know if she should pres the button when she wakes up. I instructed her to press the button whenever she experienced any of the symptoms outlined in her instructions. I had her read those to me. She wanted to know what time and how long it would take for the removal of the electrodes on Thursday, I told her I will have our EEG tech, Ala Bent call her tomorrow with that information. The Pt verbalized her understanding.

## 2017-12-28 ENCOUNTER — Telehealth: Payer: Self-pay | Admitting: *Deleted

## 2017-12-28 ENCOUNTER — Encounter: Payer: Self-pay | Admitting: Neurology

## 2017-12-28 NOTE — Telephone Encounter (Signed)
There really isn't anything else to take except Benadryl.  However, if she already had one of her habitual seizures, she may discontinue the EEG.

## 2017-12-28 NOTE — Telephone Encounter (Signed)
Called to respond to "itching" concern from electrode.  Shelia Walker could not come to the phone because was "having a seizure" at the moment. I relayed to Elijah that Dr. Everlena Cooper said if the seizures she had are representative of her typical seizures we can remove the electrodes. He asked Shelia Walker and she confirmed they were but worse. Said she had 5 seizures during the past 25 hours. He stated Shelia Walker said they are worse due to the stress of not being able to shower and feeling disgusting.  We agreed that they will be here between 2:00 and 2:30 for removal and if the seizure she was having currently worsened they will proceed to the emergency room.

## 2018-01-03 ENCOUNTER — Ambulatory Visit (HOSPITAL_COMMUNITY)
Admission: EM | Admit: 2018-01-03 | Discharge: 2018-01-03 | Disposition: A | Discharge status: Home / Self Care | Payer: BLUE CROSS/BLUE SHIELD | Attending: Family Medicine | Admitting: Family Medicine

## 2018-01-03 ENCOUNTER — Encounter (HOSPITAL_COMMUNITY): Payer: Self-pay | Admitting: Emergency Medicine

## 2018-01-03 DIAGNOSIS — F419 Anxiety disorder, unspecified: Secondary | ICD-10-CM | POA: Insufficient documentation

## 2018-01-03 DIAGNOSIS — R350 Frequency of micturition: Secondary | ICD-10-CM | POA: Diagnosis present

## 2018-01-03 DIAGNOSIS — Z888 Allergy status to other drugs, medicaments and biological substances status: Secondary | ICD-10-CM | POA: Diagnosis not present

## 2018-01-03 DIAGNOSIS — R319 Hematuria, unspecified: Secondary | ICD-10-CM | POA: Diagnosis not present

## 2018-01-03 DIAGNOSIS — Z8249 Family history of ischemic heart disease and other diseases of the circulatory system: Secondary | ICD-10-CM | POA: Insufficient documentation

## 2018-01-03 DIAGNOSIS — F329 Major depressive disorder, single episode, unspecified: Secondary | ICD-10-CM | POA: Diagnosis not present

## 2018-01-03 DIAGNOSIS — F4310 Post-traumatic stress disorder, unspecified: Secondary | ICD-10-CM | POA: Diagnosis not present

## 2018-01-03 DIAGNOSIS — Z818 Family history of other mental and behavioral disorders: Secondary | ICD-10-CM | POA: Diagnosis not present

## 2018-01-03 DIAGNOSIS — Z823 Family history of stroke: Secondary | ICD-10-CM | POA: Diagnosis not present

## 2018-01-03 DIAGNOSIS — Z8269 Family history of other diseases of the musculoskeletal system and connective tissue: Secondary | ICD-10-CM | POA: Diagnosis not present

## 2018-01-03 DIAGNOSIS — N390 Urinary tract infection, site not specified: Secondary | ICD-10-CM

## 2018-01-03 DIAGNOSIS — Z87891 Personal history of nicotine dependence: Secondary | ICD-10-CM | POA: Insufficient documentation

## 2018-01-03 DIAGNOSIS — Z79899 Other long term (current) drug therapy: Secondary | ICD-10-CM | POA: Insufficient documentation

## 2018-01-03 LAB — POCT URINALYSIS DIP (DEVICE)
Bilirubin Urine: NEGATIVE
Glucose, UA: NEGATIVE mg/dL
Ketones, ur: NEGATIVE mg/dL
Nitrite: NEGATIVE
Protein, ur: NEGATIVE mg/dL
Specific Gravity, Urine: 1.025 (ref 1.005–1.030)
Urobilinogen, UA: 0.2 mg/dL (ref 0.0–1.0)
pH: 6 (ref 5.0–8.0)

## 2018-01-03 LAB — POCT PREGNANCY, URINE: Preg Test, Ur: NEGATIVE

## 2018-01-03 MED ORDER — CEPHALEXIN 500 MG PO CAPS: 500.0000 mg | ORAL_CAPSULE | Freq: Two times a day (BID) | ORAL | 0 refills | Status: AC

## 2018-01-03 NOTE — Progress Notes (Signed)
ELECTROENCEPHALOGRAM REPORT  Dates of Recording: 12/26/2017 at 10:53 AM to 12/28/2017 at 02:00 PM  Patient's Name: Shelia Walker MRN: [id number] Date of Birth: 1995/12/18  Procedure: 48-hour ambulatory EEG  History: 22 year old transgender female presenting with recurrent seizure-like activity.  Medications:  PROZAC 20 MG tablet  ATIVAN 1 MG tablet  ALEVE 220 MG tablet  DEPOTESTOSTERONE CYPIONATE 200 MG/ML injection  DRISDOL 19147 units CAPS capsule  Technical Summary: This is a 48-hour multichannel digital EEG recording measured by the international 10-20 system with electrodes applied with paste and impedances below 5000 ohms performed as portable with EKG monitoring.  The digital EEG was referentially recorded, reformatted, and digitally filtered in a variety of bipolar and referential montages for optimal display.    DESCRIPTION OF RECORDING: During maximal wakefulness, the background activity consisted of a symmetric 10.5Hz  posterior dominant rhythm which was reactive to eye opening.  There were no epileptiform discharges or focal slowing seen in wakefulness.  During the recording, the patient progresses through wakefulness, drowsiness, and Stage 2 sleep.  Again, there were no epileptiform discharges seen.  Events: Patient had several reported spells, including episode of numbness and tingling in extremities, trouble seeing, trouble standing and walking and trouble writing.  Each of these events had no electrographic correlate.  Patient had a push button event on 12/28/2017 at 10:08 AM, which was characteristic of her habitual spells.  Event was difficult to visualized as patient was laying on bed with back to camera.  There was no associated electrographic seizures or epileptiform discharges.  EKG lead was unremarkable.  IMPRESSION: This 48-hour ambulatory EEG study is normal.    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   Shon Millet, DO

## 2018-01-03 NOTE — Discharge Instructions (Signed)
Urine culture sent.  We will call you with the results.   Push fluids and get plenty of rest.   Take antibiotic as directed and to completion Follow up with PCP if symptoms persists Return here or go to ER if you have any new or worsening symptoms such as fever, worsening abdominal pain, nausea/vomiting, flank pain, etc... 

## 2018-01-03 NOTE — ED Provider Notes (Signed)
[facility]   CC: UTI  SUBJECTIVE:  Shelia Walker is a 22 y.o. female who complains of urinary frequency, urgency, dysuria, and left flank pain that began 1 day ago.  Admits to drinking excessive caffeine.  Localizes the pain to the left flank pain.  Pain is intermittent and describes it as 4/10.  Has tried water, cranberry juice without relief.  Symptoms are made worse with urination.  Admits to similar symptoms in the past.  Denies fever, chills, nausea, vomiting, abdominal pain, abnormal vaginal discharge or bleeding, hematuria.    LMP: No LMP recorded. (Menstrual status: IUD).  ROS: As in HPI.  Past Medical History:  Diagnosis Date  . Anxiety   . Depression   . Migraines   . PTSD (post-traumatic stress disorder)   . Transgender    Past Surgical History:  Procedure Laterality Date  . WISDOM TOOTH EXTRACTION     Allergies  Allergen Reactions  . Ginger Anaphylaxis  . Pyridium [Phenazopyridine Hcl] Nausea And Vomiting    headache   No current facility-administered medications on file prior to encounter.    Current Outpatient Medications on File Prior to Encounter  Medication Sig Dispense Refill  . B-D 3CC LUER-LOK SYR 25GX1/2" 25G X 1-1/2" 3 ML MISC INJECT 1 ML Q OTHER WEEK INTO MUSCLE UTD  1  . BD DISP NEEDLES 22G X 1-1/2" MISC USE UTD  1  . FLUoxetine (PROZAC) 20 MG tablet Take 1 tablet (20 mg total) by mouth daily. 90 tablet 1  . naproxen sodium (ALEVE) 220 MG tablet Take 880 mg by mouth daily as needed (pain).    Marland Kitchen testosterone cypionate (DEPOTESTOSTERONE CYPIONATE) 200 MG/ML injection Inject 200 mg into the muscle every 14 (fourteen) days. Has not injected in 3 weeks  0  . Vitamin D, Ergocalciferol, (DRISDOL) 50000 units CAPS capsule Take 1 capsule (50,000 Units total) by mouth every 7 (seven) days. (Patient taking differently: Take 50,000 Units by mouth every 7 (seven) days. Has not started) 12 capsule 0   Social History   Socioeconomic History  .  Marital status: Single    Spouse name: Not on file  . Number of children: Not on file  . Years of education: Not on file  . Highest education level: Some college, no degree  Occupational History  . Occupation: Consulting civil engineer  Social Needs  . Financial resource strain: Not on file  . Food insecurity:    Worry: Not on file    Inability: Not on file  . Transportation needs:    Medical: Not on file    Non-medical: Not on file  Tobacco Use  . Smoking status: Former Games developer  . Smokeless tobacco: Never Used  Substance and Sexual Activity  . Alcohol use: Not Currently    Comment: social  . Drug use: Not Currently    Types: Marijuana    Comment: social  . Sexual activity: Not on file  Lifestyle  . Physical activity:    Days per week: Not on file    Minutes per session: Not on file  . Stress: Not on file  Relationships  . Social connections:    Talks on phone: Not on file    Gets together: Not on file    Attends religious service: Not on file    Active member of club or organization: Not on file    Attends meetings of clubs or organizations: Not on file    Relationship status: Not on file  . Intimate partner violence:  Fear of current or ex partner: Not on file    Emotionally abused: Not on file    Physically abused: Not on file    Forced sexual activity: Not on file  Other Topics Concern  . Not on file  Social History Narrative   Patient is right-handed. The patient lives with her same sex partner and 2 other roommates in a 2 story house. She occasionally drinks coffee and tea, and has a soda 3-4 x a week. She does not exercise   Family History  Problem Relation Age of Onset  . Fibromyalgia Mother   . Cerebral aneurysm Mother   . Hyperlipidemia Mother   . Hypertension Mother   . Stroke Mother        x3, first stroke age 46, third stroke 2013  . Depression Mother   . Autoimmune disease Mother   . Depression Father   . Fibromyalgia Maternal Grandmother   . Arthritis  Maternal Grandmother   . Diabetes Maternal Grandmother   . Depression Maternal Grandmother   . Depression Sister   . Learning disabilities Sister   . Mental illness Sister   . Healthy Brother     OBJECTIVE:  Vitals:   01/03/18 1011  BP: 126/76  Pulse: 74  Resp: 18  Temp: 98.3 F (36.8 C)  TempSrc: Oral  SpO2: 99%   General appearance: AOx3 in no acute distress; nontoxic appearance HEENT: NCAT.  Oropharynx clear.  Lungs: clear to auscultation bilaterally without adventitious breath sounds Heart: regular rate and rhythm.  Radial pulses 2+ symmetrical bilaterally Abdomen: soft; non-distended; mild tenderness suprapubic; bowel sounds present; no guarding or rebound tenderness Back: no CVA tenderness Extremities: no edema; symmetrical with no gross deformities Skin: warm and dry Neurologic: Ambulates from chair to exam table without difficulty Psychological: alert and cooperative; normal mood and affect  Labs Reviewed  POCT URINALYSIS DIP (DEVICE) - Abnormal; Notable for the following components:      Result Value   Hgb urine dipstick TRACE (*)    Leukocytes, UA SMALL (*)    All other components within normal limits  URINE CULTURE  POCT PREGNANCY, URINE    ASSESSMENT & PLAN:  1. Urinary tract infection with hematuria, site unspecified     Meds ordered this encounter  Medications  . cephALEXin (KEFLEX) 500 MG capsule    Sig: Take 1 capsule (500 mg total) by mouth 2 (two) times daily for 7 days.    Dispense:  14 capsule    Refill:  0    Order Specific Question:   Supervising Provider    Answer:   Isa Rankin [[id number]]   Urine culture sent.  We will call you with the results.   Push fluids and get plenty of rest.   Take antibiotic as directed and to completion Follow up with PCP if symptoms persists Return here or go to ER if you have any new or worsening symptoms such as fever, worsening abdominal pain, nausea/vomiting, flank pain, etc...  Outlined signs  and symptoms indicating need for more acute intervention. Patient verbalized understanding. After Visit Summary given.     Rennis Harding, PA-C 01/03/18 1050

## 2018-01-03 NOTE — ED Triage Notes (Signed)
Pt here for UTI sx with some back pain

## 2018-01-03 NOTE — Procedures (Signed)
ELECTROENCEPHALOGRAM REPORT  Dates of Recording: 12/26/2017 at 10:53 AM to 12/28/2017 at 02:00 PM  Patient's Name: Shelia Walker MRN: [id number] Date of Birth: 01/31/1996  Procedure: 48-hour ambulatory EEG  History: 22 year old transgender female presenting with recurrent seizure-like activity.  Medications:  PROZAC 20 MG tablet  ATIVAN 1 MG tablet  ALEVE 220 MG tablet  DEPOTESTOSTERONE CYPIONATE 200 MG/ML injection  DRISDOL 50000 units CAPS capsule  Technical Summary: This is a 48-hour multichannel digital EEG recording measured by the international 10-20 system with electrodes applied with paste and impedances below 5000 ohms performed as portable with EKG monitoring.  The digital EEG was referentially recorded, reformatted, and digitally filtered in a variety of bipolar and referential montages for optimal display.    DESCRIPTION OF RECORDING: During maximal wakefulness, the background activity consisted of a symmetric 10.5Hz posterior dominant rhythm which was reactive to eye opening.  There were no epileptiform discharges or focal slowing seen in wakefulness.  During the recording, the patient progresses through wakefulness, drowsiness, and Stage 2 sleep.  Again, there were no epileptiform discharges seen.  Events: Patient had several reported spells, including episode of numbness and tingling in extremities, trouble seeing, trouble standing and walking and trouble writing.  Each of these events had no electrographic correlate.  Patient had a push button event on 12/28/2017 at 10:08 AM, which was characteristic of her habitual spells.  Event was difficult to visualized as patient was laying on bed with back to camera.  There was no associated electrographic seizures or epileptiform discharges.  EKG lead was unremarkable.  IMPRESSION: This 48-hour ambulatory EEG study is normal.    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   Adam Jaffe, DO 

## 2018-01-05 LAB — URINE CULTURE: Culture: 80000 — AB

## 2018-01-06 ENCOUNTER — Telehealth (HOSPITAL_COMMUNITY): Payer: Self-pay

## 2018-01-06 NOTE — Telephone Encounter (Signed)
attempted to reach patient to assess symptoms per Rennis Harding PA. If still symptomatic will order Macrobid. Attempted to reach patient. No answer at this time. Voicemail left.

## 2018-01-09 ENCOUNTER — Telehealth (HOSPITAL_COMMUNITY): Payer: Self-pay | Admitting: Emergency Medicine

## 2018-01-09 NOTE — Telephone Encounter (Signed)
Attempted to reach patient x2. No answer at this time. Voicemail left.    

## 2018-01-09 NOTE — Progress Notes (Signed)
Office Visit Note  Patient: Shelia Walker             Date of Birth: 1995/03/23           MRN: [id number]             PCP: Anne Ng, NP Referring: Anne Ng, NP Visit Date: 01/23/2018 Occupation: @GUAROCC @  Subjective:  Still pain and muscle weakness   History of Present Illness: Shelia Walker is a 22 y.o. female with history of myalgias.  We felt that the elevated muscle enzymes were possibly from testosterone.  Patient has been off testosterone for 1 month now.  The patient the symptoms  are getting worse. Shelia Walker is experiencing increased muscle pain and fatigue.  The pain is mostly in the knee joints and lower back muscles.  She continues to use a cane to walk and having difficulty walking, climbing stairs and getting up from the chair.  Activities of Daily Living:  Patient reports morning stiffness for 45 minutes.   Patient Reports nocturnal pain.  Difficulty dressing/grooming: Reports Difficulty climbing stairs: Reports Difficulty getting out of chair: Reports Difficulty using hands for taps, buttons, cutlery, and/or writing: Reports  Review of Systems  Constitutional: Positive for fatigue. Negative for night sweats, weight gain and weight loss.  HENT: Negative for mouth sores, trouble swallowing, trouble swallowing, mouth dryness and nose dryness.   Eyes: Negative for pain, redness, visual disturbance and dryness.  Respiratory: Negative for cough, shortness of breath and difficulty breathing.   Cardiovascular: Negative for chest pain, palpitations, hypertension, irregular heartbeat and swelling in legs/feet.  Gastrointestinal: Negative for blood in stool, constipation and diarrhea.  Endocrine: Negative for increased urination.  Genitourinary: Negative for difficulty urinating and vaginal dryness.  Musculoskeletal: Positive for arthralgias, gait problem, joint pain, muscle weakness, morning stiffness and muscle tenderness. Negative for joint swelling,  myalgias and myalgias.  Skin: Negative for color change, rash, hair loss, skin tightness, ulcers and sensitivity to sunlight.  Allergic/Immunologic: Negative for susceptible to infections.  Neurological: Positive for numbness. Negative for dizziness, memory loss, night sweats and weakness.  Hematological: Negative for bruising/bleeding tendency and swollen glands.  Psychiatric/Behavioral: Positive for sleep disturbance. Negative for depressed mood. The patient is nervous/anxious.     PMFS History:  Patient Active Problem List   Diagnosis Date Noted  . Positive ANA (antinuclear antibody) 11/07/2017  . 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 Severe episode of recurrent major depressive disorder, without psychotic features (HCC) 11/04/2017  . Myalgia 11/04/2017    Past Medical History:  Diagnosis Date  . Anxiety   . Depression   . Migraines   . PTSD (post-traumatic stress disorder)   . Transgender     Family History  Problem Relation Age of Onset  . Fibromyalgia Mother   . Cerebral aneurysm Mother   . Hyperlipidemia Mother   . Hypertension Mother   . Stroke Mother        x3, first stroke age 72, third stroke 2013  . Depression Mother   . Autoimmune disease Mother   . Depression Father   . Fibromyalgia Maternal Grandmother   . Arthritis Maternal Grandmother   . Diabetes Maternal Grandmother   . Depression Maternal Grandmother   . Depression Sister   . Learning disabilities Sister   . Mental illness Sister   . Healthy Brother    Past Surgical History:  Procedure Laterality Date  . WISDOM TOOTH EXTRACTION     Social History   Social History Narrative  Patient is right-handed. The patient lives with her same sex partner and 2 other roommates in a 2 story house. She occasionally drinks coffee and tea, and has a soda 3-4 x a week. She does not exercise    Objective: Vital Signs: BP 106/64 (BP Location: Left Arm, Patient Position: Sitting, Cuff Size: Normal)    Pulse 89   Resp 16   Ht 5\' 3"  (1.6 m)   Wt 156 lb 9.6 oz (71 kg)   BMI 27.74 kg/m    Physical Exam  Constitutional: [name], place, and time. She appears well-developed and well-nourished.  HENT:  Head: Normocephalic and atraumatic.  Eyes: Conjunctivae and EOM are normal.  Neck: Normal range of motion.  Cardiovascular: Normal rate, regular rhythm, normal heart sounds and intact distal pulses.  Pulmonary/Chest: Effort normal and breath sounds normal.  Abdominal: Soft. Bowel sounds are normal.  Lymphadenopathy:    She has no cervical adenopathy.  Neurological: She is alert and oriented to person, place, and time.  Skin: Skin is warm and dry. Capillary refill takes less than 2 seconds.  Psychiatric: She has a normal mood and affect. Her behavior is normal.  Nursing note and vitals reviewed.    Musculoskeletal Exam: Lumbar spine good range of motion.  Shoulder joints elbow joints wrist joint MCPs PIPs DIPs were in good range of motion.  Hip joints knee joints ankles MTPs PIPs were in good range of motion with no synovitis.  Muscle strength was 4+/5 in all extremities.  Although patient had difficulty getting up from the chair without using the armrest.  CDAI Exam: CDAI Score: Not documented Patient Global Assessment: Not documented; Provider Global Assessment: Not documented Swollen: Not documented; Tender: Not documented Joint Exam   Not documented   There is currently no information documented on the homunculus. Go to the Rheumatology activity and complete the homunculus joint exam.  Investigation: No additional findings.  Imaging: No results found.  Recent Labs: Lab Results  Component Value Date   WBC 11.4 (H) 12/03/2017   HGB 16.6 (H) 12/03/2017   PLT 405 (H) 12/03/2017   NA 142 12/03/2017   K 4.0 12/03/2017   CL 103 12/03/2017   CO2 28 12/03/2017   GLUCOSE 90 12/03/2017   BUN 14 12/03/2017   CREATININE 0.86 12/03/2017   BILITOT 0.8 12/03/2017     ALKPHOS 73 12/03/2017   AST 28 12/03/2017   ALT 43 12/03/2017   PROT 8.9 (H) 12/03/2017   ALBUMIN 4.9 12/03/2017   CALCIUM 9.8 12/03/2017   GFRAA >60 12/03/2017    Speciality Comments: No specialty comments available.  Procedures:  No procedures performed Allergies: Ginger and Pyridium [phenazopyridine hcl]   Assessment / Plan:     Visit Diagnoses: Myalgia - Elevated CK and aldolase.  EMG and nerve conduction velocity was normal by neurologist.  Patient's symptoms of myalgia and muscle weakness are worse now.  Patient has been off testosterone for a month now and has not noticed any improvement.  Myositis panel is negative - Plan: ANA, CK, Aldolase.  I will also make a referral to [facility].  Positive ANA (antinuclear antibody) - ANA was positive at 1: 640 but she has no clinical features of autoimmune disease.  ENA was negative - Plan: ANA today to see if there is a downward trend toward it was a false positive test.  Female-to-female transgender person  Severe episode of recurrent major depressive disorder, without psychotic features (HCC) - patient is  on medications but according to patient the depression has been worse since the onset of pseudoseizures.  Orders: Orders Placed This Encounter  Procedures  . ANA  . CK  . Aldolase   No orders of the defined types were placed in this encounter.   .  Follow-Up Instructions: Return if symptoms worsen or fail to improve, for Elevated CK.   Pollyann Savoy, MD  Note - This record has been created using Animal nutritionist.  Chart creation errors have been sought, but may not always  have been located. Such creation errors do not reflect on  the standard of medical care.

## 2018-01-11 DIAGNOSIS — F640 Transsexualism: Secondary | ICD-10-CM | POA: Diagnosis not present

## 2018-01-11 NOTE — Progress Notes (Signed)
NEUROLOGY FOLLOW UP OFFICE NOTE  Shelia Walker [id number]  HISTORY OF PRESENT ILLNESS: Shelia Walker is a 22 year old right-handed female to female transgender person with depression, anxiety, PTSD and migraines who follows up for recurrent seizure-like spells and myalgias.   Patient is accompanied by their significant other who supplements history.  UPDATE:  A 72-hour ambulatory EEG was ordered.  Patient only had 48 hours performed from 12/26/2017 to 12/28/2017 due to intolerance of the electrodes.  They reported multiple spells during the recording one such habitual documented spell showed no electrographic correlate.  EEG was normal with no evidence of epileptiform discharges or electrographic seizures, indicating that her spells are nonepileptic.    They report one other seizure since the EEG. They still note muscle weakness in the legs. They have a therapist.  Depression and anxiety is reportedly improved.  HISTORY: I  Altered Mental Status: Patient presented to the ED on 11/29/17. He was in class when they suddenly started feeling his habitual panic attack, they had trouble breathing, patient was shaky in the hands. They then developed decreased vision in the left eye, had difficulty forming words,pain in back and calves,unable to move and feeling confused (trouble remembering conversations). Episode was witnessed by their partner. EMS was called. They did not lose consciousness. No associated tongue biting or urinary or bowel incontinence. Symptoms lastedabout 30 minutesand resolved while in the ambulance. They has never had a similar event.CT of head was personally reviewed and was unremarkable. There were no electrolyte abnormalities. UA did reveal small leukocytes and rare bacteria. Troponin was negative. As per ED note, exam was noted for unilateral sluggish pupil and uvula deviation. Otherwise, exam unremarkable. Possible seizure was suggested. Since then, they still  feels shaky. Patient is having more difficulty holding a pen. In class, they had trouble with recalling information from class.  MRI/MRA of head without contrast from 12/03/17 personally reviewed and were normal.  Sleep-deprived EEG from 12/05/17 was normal.  Patient was seen in the ED on 12/03/17 and 12/07/17.  The first episode involved body stiffening with bilateral upper extremity shaking that spread to the lower extremities, with no loss of consciousness or awareness.  They appeared to be scratching herself and was short of breath.  Episode lasted 45 minutes.  There has been a total of 6 events.   II  Myalgias & Muscle Weakness: Patient was also evaluated by rheumatology for myalgias, arthralgias, muscle weakness and fatigue since February 2019. They began using a cane.  He had a positive ANA 1:640 speckled, sed rate 32, TSH 1.80, negative RF. ENA was negative.  CK from 11/21/17 was 536 and aldolase 10.4. They had been taking intramuscular testosterone.Labs from 11/22/17 revealed negative myositis autoantibody panel (PL-7, PL-12, Mi-2, Ku, EJ, OJ, SRP, Jo-1) was negative.  NCV-EMG from 12/05/17 was normal with no evidence of myopathy.  Magnesium was 2..  Vitamin D was low at 22, so the patient was recommended to take supplement.  Repeat CK from 12/19/17 was 267.  Due to trending down CK, muscle biopsy was cancelled.  They stopped intramuscular testosterone in early October 2019.  Family history noted that patient's mother has had 3 strokes. The first stroke was at age 52. She has had 2 other strokes, the last one in 2015. Mother also has a cerebral aneurysm. Patient has no other family history of aneurysms.  PAST MEDICAL HISTORY: Past Medical History:  Diagnosis Date  . Anxiety   . Depression   . Migraines   .  PTSD (post-traumatic stress disorder)   . Transgender     MEDICATIONS: Current Outpatient Medications on File Prior to Visit  Medication Sig Dispense Refill  . B-D 3CC LUER-LOK SYR  25GX1/2" 25G X 1-1/2" 3 ML MISC INJECT 1 ML Q OTHER WEEK INTO MUSCLE UTD  1  . BD DISP NEEDLES 22G X 1-1/2" MISC USE UTD  1  . FLUoxetine (PROZAC) 20 MG tablet Take 1 tablet (20 mg total) by mouth daily. 90 tablet 1  . naproxen sodium (ALEVE) 220 MG tablet Take 880 mg by mouth daily as needed (pain).    Marland Kitchen testosterone cypionate (DEPOTESTOSTERONE CYPIONATE) 200 MG/ML injection Inject 200 mg into the muscle every 14 (fourteen) days. Has not injected in 3 weeks  0  . Vitamin D, Ergocalciferol, (DRISDOL) 50000 units CAPS capsule Take 1 capsule (50,000 Units total) by mouth every 7 (seven) days. (Patient taking differently: Take 50,000 Units by mouth every 7 (seven) days. Has not started) 12 capsule 0   No current facility-administered medications on file prior to visit.     ALLERGIES: Allergies  Allergen Reactions  . Ginger Anaphylaxis  . Pyridium [Phenazopyridine Hcl] Nausea And Vomiting    headache    FAMILY HISTORY: Family History  Problem Relation Age of Onset  . Fibromyalgia Mother   . Cerebral aneurysm Mother   . Hyperlipidemia Mother   . Hypertension Mother   . Stroke Mother        x3, first stroke age 2, third stroke 2013  . Depression Mother   . Autoimmune disease Mother   . Depression Father   . Fibromyalgia Maternal Grandmother   . Arthritis Maternal Grandmother   . Diabetes Maternal Grandmother   . Depression Maternal Grandmother   . Depression Sister   . Learning disabilities Sister   . Mental illness Sister   . Healthy Brother    SOCIAL HISTORY: Social History   Socioeconomic History  . Marital status: Single    Spouse name: Not on file  . Number of children: Not on file  . Years of education: Not on file  . Highest education level: Some college, no degree  Occupational History  . Occupation: Consulting civil engineer  Social Needs  . Financial resource strain: Not on file  . Food insecurity:    Worry: Not on file    Inability: Not on file  . Transportation needs:     Medical: Not on file    Non-medical: Not on file  Tobacco Use  . Smoking status: Former Games developer  . Smokeless tobacco: Never Used  Substance and Sexual Activity  . Alcohol use: Not Currently    Comment: social  . Drug use: Not Currently    Types: Marijuana    Comment: social  . Sexual activity: Not on file  Lifestyle  . Physical activity:    Days per week: Not on file    Minutes per session: Not on file  . Stress: Not on file  Relationships  . Social connections:    Talks on phone: Not on file    Gets together: Not on file    Attends religious service: Not on file    Active member of club or organization: Not on file    Attends meetings of clubs or organizations: Not on file    Relationship status: Not on file  . Intimate partner violence:    Fear of current or ex partner: Not on file    Emotionally abused: Not on file    Physically  abused: Not on file    Forced sexual activity: Not on file  Other Topics Concern  . Not on file  Social History Narrative   Patient is right-handed. The patient lives with her same sex partner and 2 other roommates in a 2 story house. She occasionally drinks coffee and tea, and has a soda 3-4 x a week. She does not exercise    REVIEW OF SYSTEMS: Constitutional: No fevers, chills, or sweats, no generalized fatigue, change in appetite Eyes: No visual changes, double vision, eye pain Ear, nose and throat: No hearing loss, ear pain, nasal congestion, sore throat Cardiovascular: No chest pain, palpitations Respiratory:  No shortness of breath at rest or with exertion, wheezes GastrointestinaI: No nausea, vomiting, diarrhea, abdominal pain, fecal incontinence Genitourinary:  No dysuria, urinary retention or frequency Musculoskeletal:  No neck pain, back pain Integumentary: No rash, pruritus, skin lesions Neurological: as above Psychiatric: No depression, insomnia, anxiety Endocrine: No palpitations, fatigue, diaphoresis, mood swings, change in  appetite, change in weight, increased thirst Hematologic/Lymphatic:  No purpura, petechiae. Allergic/Immunologic: no itchy/runny eyes, nasal congestion, recent allergic reactions, rashes  PHYSICAL EXAM: Blood pressure 108/82, pulse 89, height 5\' 3"  (1.6 m), weight 158 lb (71.7 kg), SpO2 98 %. General: No acute distress.  Patient appears well-groomed.   Head:  Normocephalic/atraumatic Eyes:  Fundi examined but not visualized Neurological Exam: alert and oriented to person, place, and time. Attention span and concentration intact, recent and remote memory intact, fund of knowledge intact.  Speech fluent and not dysarthric, language intact.  CN II-XII intact. Bulk and tone normal, muscle strength 5/5 throughout.  Sensation to light touch intact.  Deep tendon reflexes 2+ throughout, toes downgoing.  Finger to nose and heel to shin testing intact.  Gait normal, Romberg negative.  IMPRESSION: 1.  Psychogenic Nonepileptic Seizures.  She experienced multiple spells of different but habitual semiology, all with no electrographic correlate.  Patient reports that depression and anxiety is controlled.  However, there may still be underlying anxiety contributing to symptoms. 2.  Myalgias/mildly elevated CK, which has since trended down.  No evidence of myopathy on testing.  Query if elevated CK may have been secondary to testosterone.  Regarding spells, I would recommend following up with their therapist.  They are scheduled to have repeat CK labs performed by Dr. Corliss Skainseveshwar No further neurologic testing or follow up needed at this time.  If there are any questions or concerns regarding repeat CK, Dr. Corliss Skainseveshwar may contact me and I can re-evaluate.  15 minutes spent face to face with patient, over 50% spent discussing diagnosis and recommendations.  Shon MilletAdam , DO  CC:  Alysia Pennaharlotte Nche, NP  Pollyann SavoyShaili Deveshwar, MD

## 2018-01-13 ENCOUNTER — Encounter: Payer: Self-pay | Admitting: Neurology

## 2018-01-13 ENCOUNTER — Ambulatory Visit (INDEPENDENT_AMBULATORY_CARE_PROVIDER_SITE_OTHER): Payer: BLUE CROSS/BLUE SHIELD | Admitting: Neurology

## 2018-01-13 VITALS — BP 108/82 | HR 89 | Ht 63.0 in | Wt 158.0 lb

## 2018-01-13 DIAGNOSIS — R569 Unspecified convulsions: Secondary | ICD-10-CM | POA: Diagnosis not present

## 2018-01-13 DIAGNOSIS — R748 Abnormal levels of other serum enzymes: Secondary | ICD-10-CM

## 2018-01-13 DIAGNOSIS — M791 Myalgia, unspecified site: Secondary | ICD-10-CM | POA: Diagnosis not present

## 2018-01-13 NOTE — Patient Instructions (Signed)
1.  Your events are not epileptic seizures.  They may be stress-related.  I would discuss your symptoms with your therapist.   2.  The elevated CK may have been due to the testosterone.  There is no evidence of a muscle disease.  Follow up for the next CK lab as scheduled.

## 2018-01-23 ENCOUNTER — Encounter: Payer: Self-pay | Admitting: Physician Assistant

## 2018-01-23 ENCOUNTER — Ambulatory Visit (INDEPENDENT_AMBULATORY_CARE_PROVIDER_SITE_OTHER): Payer: BLUE CROSS/BLUE SHIELD | Admitting: Rheumatology

## 2018-01-23 VITALS — BP 106/64 | HR 89 | Resp 16 | Ht 63.0 in | Wt 156.6 lb

## 2018-01-23 DIAGNOSIS — R7689 Other specified abnormal immunological findings in serum: Secondary | ICD-10-CM

## 2018-01-23 DIAGNOSIS — F640 Transsexualism: Secondary | ICD-10-CM | POA: Diagnosis not present

## 2018-01-23 DIAGNOSIS — M791 Myalgia, unspecified site: Secondary | ICD-10-CM

## 2018-01-23 DIAGNOSIS — R768 Other specified abnormal immunological findings in serum: Secondary | ICD-10-CM | POA: Diagnosis not present

## 2018-01-23 DIAGNOSIS — F332 Major depressive disorder, recurrent severe without psychotic features: Secondary | ICD-10-CM

## 2018-01-23 DIAGNOSIS — Z789 Other specified health status: Secondary | ICD-10-CM

## 2018-01-24 ENCOUNTER — Other Ambulatory Visit: Payer: Self-pay | Admitting: *Deleted

## 2018-01-24 DIAGNOSIS — M791 Myalgia, unspecified site: Secondary | ICD-10-CM

## 2018-01-24 DIAGNOSIS — F640 Transsexualism: Secondary | ICD-10-CM | POA: Diagnosis not present

## 2018-01-24 NOTE — Progress Notes (Signed)
CK is much better.  Please notify patient and also forward a copy to her neurologist and PCP.

## 2018-01-25 LAB — ANA: Anti Nuclear Antibody(ANA): POSITIVE — AB

## 2018-01-25 LAB — ANTI-NUCLEAR AB-TITER (ANA TITER): ANA Titer 1: 1:640 {titer} — ABNORMAL HIGH

## 2018-01-25 LAB — ALDOLASE: Aldolase: 5 U/L (ref <=8.1)

## 2018-01-25 LAB — CK: Total CK: 176 U/L — ABNORMAL HIGH (ref 29–143)

## 2018-01-25 NOTE — Progress Notes (Signed)
ANA is a still positive.  CK and aldolase are normal.  Please check on the referral will be made to [facility].

## 2018-01-30 DIAGNOSIS — F640 Transsexualism: Secondary | ICD-10-CM | POA: Diagnosis not present

## 2018-02-03 ENCOUNTER — Ambulatory Visit (INDEPENDENT_AMBULATORY_CARE_PROVIDER_SITE_OTHER): Payer: BLUE CROSS/BLUE SHIELD | Admitting: Nurse Practitioner

## 2018-02-03 ENCOUNTER — Encounter: Payer: Self-pay | Admitting: Nurse Practitioner

## 2018-02-03 VITALS — BP 120/90 | HR 115 | Temp 97.9°F | Ht 63.0 in | Wt 158.0 lb

## 2018-02-03 DIAGNOSIS — M791 Myalgia, unspecified site: Secondary | ICD-10-CM | POA: Diagnosis not present

## 2018-02-03 DIAGNOSIS — J020 Streptococcal pharyngitis: Secondary | ICD-10-CM | POA: Diagnosis not present

## 2018-02-03 DIAGNOSIS — F332 Major depressive disorder, recurrent severe without psychotic features: Secondary | ICD-10-CM | POA: Diagnosis not present

## 2018-02-03 LAB — POCT RAPID STREP A (OFFICE): Rapid Strep A Screen: POSITIVE — AB

## 2018-02-03 MED ORDER — DULOXETINE HCL 30 MG PO CPEP: 30.0000 mg | ORAL_CAPSULE | Freq: Every day | ORAL | 1 refills | Status: DC

## 2018-02-03 MED ORDER — AMOXICILLIN 875 MG PO TABS: 875.0000 mg | ORAL_TABLET | Freq: Two times a day (BID) | ORAL | 0 refills | Status: DC

## 2018-02-03 NOTE — Patient Instructions (Addendum)
Maintain upcoming appt with me Start cymbalta tomorrow. Stop prozac.  Duloxetine delayed-release capsules What is this medicine? DULOXETINE (doo LOX e teen) is used to treat depression, anxiety, and different types of chronic pain. This medicine may be used for other purposes; ask your health care provider or pharmacist if you have questions. COMMON BRAND NAME(S): Cymbalta, Irenka What should I tell my health care provider before I take this medicine? They need to know if you have any of these conditions: -bipolar disorder or a family history of bipolar disorder -glaucoma -kidney disease -liver disease -suicidal thoughts or a previous suicide attempt -taken medicines called MAOIs like Carbex, Eldepryl, Marplan, Nardil, and Parnate within 14 days -an unusual reaction to duloxetine, other medicines, foods, dyes, or preservatives -pregnant or trying to get pregnant -breast-feeding How should I use this medicine? Take this medicine by mouth with a glass of water. Follow the directions on the prescription label. Do not cut, crush or chew this medicine. You can take this medicine with or without food. Take your medicine at regular intervals. Do not take your medicine more often than directed. Do not stop taking this medicine suddenly except upon the advice of your doctor. Stopping this medicine too quickly may cause serious side effects or your condition may worsen. A special MedGuide will be given to you by the pharmacist with each prescription and refill. Be sure to read this information carefully each time. Talk to your pediatrician regarding the use of this medicine in children. While this drug may be prescribed for children as young as 53 years of age for selected conditions, precautions do apply. Overdosage: If you think you have taken too much of this medicine contact a poison control center or emergency room at once. NOTE: This medicine is only for you. Do not share this medicine with  others. What if I miss a dose? If you miss a dose, take it as soon as you can. If it is almost time for your next dose, take only that dose. Do not take double or extra doses. What may interact with this medicine? Do not take this medicine with any of the following medications: -desvenlafaxine -levomilnacipran -linezolid -MAOIs like Carbex, Eldepryl, Marplan, Nardil, and Parnate -methylene blue (injected into a vein) -milnacipran -thioridazine -venlafaxine This medicine may also interact with the following medications: -alcohol -amphetamines -aspirin and aspirin-like medicines -certain antibiotics like ciprofloxacin and enoxacin -certain medicines for blood pressure, heart disease, irregular heart beat -certain medicines for depression, anxiety, or psychotic disturbances -certain medicines for migraine headache like almotriptan, eletriptan, frovatriptan, naratriptan, rizatriptan, sumatriptan, zolmitriptan -certain medicines that treat or prevent blood clots like warfarin, enoxaparin, and dalteparin -cimetidine -fentanyl -lithium -NSAIDS, medicines for pain and inflammation, like ibuprofen or naproxen -phentermine -procarbazine -rasagiline -sibutramine -St. John's wort -theophylline -tramadol -tryptophan This list may not describe all possible interactions. Give your health care provider a list of all the medicines, herbs, non-prescription drugs, or dietary supplements you use. Also tell them if you smoke, drink alcohol, or use illegal drugs. Some items may interact with your medicine. What should I watch for while using this medicine? Tell your doctor if your symptoms do not get better or if they get worse. Visit your doctor or health care professional for regular checks on your progress. Because it may take several weeks to see the full effects of this medicine, it is important to continue your treatment as prescribed by your doctor. Patients and their families should watch out  for new or worsening thoughts  of suicide or depression. Also watch out for sudden changes in feelings such as feeling anxious, agitated, panicky, irritable, hostile, aggressive, impulsive, severely restless, overly excited and hyperactive, or not being able to sleep. If this happens, especially at the beginning of treatment or after a change in dose, call your health care professional. Bonita QuinYou may get drowsy or dizzy. Do not drive, use machinery, or do anything that needs mental alertness until you know how this medicine affects you. Do not stand or sit up quickly, especially if you are an older patient. This reduces the risk of dizzy or fainting spells. Alcohol may interfere with the effect of this medicine. Avoid alcoholic drinks. This medicine can cause an increase in blood pressure. This medicine can also cause a sudden drop in your blood pressure, which may make you feel faint and increase the chance of a fall. These effects are most common when you first start the medicine or when the dose is increased, or during use of other medicines that can cause a sudden drop in blood pressure. Check with your doctor for instructions on monitoring your blood pressure while taking this medicine. Your mouth may get dry. Chewing sugarless gum or sucking hard candy, and drinking plenty of water may help. Contact your doctor if the problem does not go away or is severe. What side effects may I notice from receiving this medicine? Side effects that you should report to your doctor or health care professional as soon as possible: -allergic reactions like skin rash, itching or hives, swelling of the face, lips, or tongue -anxious -breathing problems -confusion -changes in vision -chest pain -confusion -elevated mood, decreased need for sleep, racing thoughts, impulsive behavior -eye pain -fast, irregular heartbeat -feeling faint or lightheaded, falls -feeling agitated, angry, or irritable -hallucination, loss of  contact with reality -high blood pressure -loss of balance or coordination -palpitations -redness, blistering, peeling or loosening of the skin, including inside the mouth -restlessness, pacing, inability to keep still -seizures -stiff muscles -suicidal thoughts or other mood changes -trouble passing urine or change in the amount of urine -trouble sleeping -unusual bleeding or bruising -unusually weak or tired -vomiting -yellowing of the eyes or skin Side effects that usually do not require medical attention (report to your doctor or health care professional if they continue or are bothersome): -change in sex drive or performance -change in appetite or weight -constipation -dizziness -dry mouth -headache -increased sweating -nausea -tired This list may not describe all possible side effects. Call your doctor for medical advice about side effects. You may report side effects to FDA at 1-800-FDA-1088. Where should I keep my medicine? Keep out of the reach of children. Store at room temperature between 20 and 25 degrees C (68 to 77 degrees F). Throw away any unused medicine after the expiration date. NOTE: This sheet is a summary. It may not cover all possible information. If you have questions about this medicine, talk to your doctor, pharmacist, or health care provider.  2018 Elsevier/Gold Standard (2015-07-17 18:16:03)   Strep Throat Strep throat is a bacterial infection of the throat. Your health care provider may call the infection tonsillitis or pharyngitis, depending on whether there is swelling in the tonsils or at the back of the throat. Strep throat is most common during the cold months of the year in children who are 885-21 years of age, but it can happen during any season in people of any age. This infection is spread from person to person (contagious) through coughing,  sneezing, or close contact. What are the causes? Strep throat is caused by the bacteria called  Streptococcus pyogenes. What increases the risk? This condition is more likely to develop in:  People who spend time in crowded places where the infection can spread easily.  People who have close contact with someone who has strep throat.  What are the signs or symptoms? Symptoms of this condition include:  Fever or chills.  Redness, swelling, or pain in the tonsils or throat.  Pain or difficulty when swallowing.  White or yellow spots on the tonsils or throat.  Swollen, tender glands in the neck or under the jaw.  Red rash all over the body (rare).  How is this diagnosed? This condition is diagnosed by performing a rapid strep test or by taking a swab of your throat (throat culture test). Results from a rapid strep test are usually ready in a few minutes, but throat culture test results are available after one or two days. How is this treated? This condition is treated with antibiotic medicine. Follow these instructions at home: Medicines  Take over-the-counter and prescription medicines only as told by your health care provider.  Take your antibiotic as told by your health care provider. Do not stop taking the antibiotic even if you start to feel better.  Have family members who also have a sore throat or fever tested for strep throat. They may need antibiotics if they have the strep infection. Eating and drinking  Do not share food, drinking cups, or personal items that could cause the infection to spread to other people.  If swallowing is difficult, try eating soft foods until your sore throat feels better.  Drink enough fluid to keep your urine clear or pale yellow. General instructions  Gargle with a salt-water mixture 3-4 times per day or as needed. To make a salt-water mixture, completely dissolve -1 tsp of salt in 1 cup of warm water.  Make sure that all household members wash their hands well.  Get plenty of rest.  Stay home from school or work until you  have been taking antibiotics for 24 hours.  Keep all follow-up visits as told by your health care provider. This is important. Contact a health care provider if:  The glands in your neck continue to get bigger.  You develop a rash, cough, or earache.  You cough up a thick liquid that is green, yellow-brown, or bloody.  You have pain or discomfort that does not get better with medicine.  Your problems seem to be getting worse rather than better.  You have a fever. Get help right away if:  You have new symptoms, such as vomiting, severe headache, stiff or painful neck, chest pain, or shortness of breath.  You have severe throat pain, drooling, or changes in your voice.  You have swelling of the neck, or the skin on the neck becomes red and tender.  You have signs of dehydration, such as fatigue, dry mouth, and decreased urination.  You become increasingly sleepy, or you cannot wake up completely.  Your joints become red or painful. This information is not intended to replace advice given to you by your health care provider. Make sure you discuss any questions you have with your health care provider. Document Released: 02/13/2000 Document Revised: 10/15/2015 Document Reviewed: 06/10/2014 Elsevier Interactive Patient Education  [employer].

## 2018-02-03 NOTE — Progress Notes (Signed)
Subjective:  Patient ID: Shelia PlaterVictoria Cichy, female    DOB: 04/11/1995  Age: 22 y.o. MRN: [id number]  CC: Pain (pain management consult for myalgia/ pt is c/o of knee,hip and back pain. 7.5 out of 10 right now, rheumatology and neurology is not going to prescribe any pain meds. [facility]. )   Muscle Pain  This is a chronic problem. The current episode started more than 1 year ago. The problem occurs constantly. The problem is unchanged. The context of the pain is unknown. Pain location: generalized pain. The pain is severe. The symptoms are aggravated by any movement and inactivity. Associated symptoms include fatigue, stiffness, sensory change, swollen glands and weakness. Pertinent negatives include no constipation, fever or shortness of breath. Past treatments include heat pack, acetaminophen and OTC NSAID. The treatment provided no relief. There is no swelling present. There is no history of chronic back pain, rheumatic disease or sickle cell disease.  Sore Throat   This is a new problem. The current episode started yesterday. The problem has been unchanged. There has been no fever. Associated symptoms include swollen glands. Pertinent negatives include no congestion, coughing, ear pain, hoarse voice, plugged ear sensation, shortness of breath or stridor. She has had exposure to strep. She has had no exposure to mono. She has tried nothing for the symptoms.  unable to find cause of pain per rheumatology and neurology.   depression: no improve with prozac. [facility] 11/04/2017 11/04/2017  Decreased Interest 2 1  Down, Depressed, Hopeless 2 1  PHQ - 2 Score 4 2  Altered sleeping 1 -  Tired, decreased energy 2 -  Change in appetite 1 -  Feeling bad or failure about yourself  1 -  Trouble concentrating 2 -  Moving slowly or fidgety/restless 0 -  Suicidal thoughts 1 -  PHQ-9 Score 12 -   Reviewed past Medical, Social and Family history  today.  Outpatient Medications Prior to Visit  Medication Sig Dispense Refill  . B-D 3CC LUER-LOK SYR 25GX1/2" 25G X 1-1/2" 3 ML MISC INJECT 1 ML Q OTHER WEEK INTO MUSCLE UTD  1  . BD DISP NEEDLES 22G X 1-1/2" MISC USE UTD  1  . naproxen sodium (ALEVE) 220 MG tablet Take 880 mg by mouth daily as needed (pain).    . Vitamin D, Ergocalciferol, (DRISDOL) 50000 units CAPS capsule Take 1 capsule (50,000 Units total) by mouth every 7 (seven) days. (Patient taking differently: Take 50,000 Units by mouth every 7 (seven) days. Has not started) 12 capsule 0  . FLUoxetine (PROZAC) 20 MG tablet Take 1 tablet (20 mg total) by mouth daily. 90 tablet 1  . testosterone cypionate (DEPOTESTOSTERONE CYPIONATE) 200 MG/ML injection Inject 200 mg into the muscle every 14 (fourteen) days. Has not injected in 3 weeks  0   No facility-administered medications prior to visit.     ROS See HPI  Objective:  BP 120/90   Pulse (!) 115   Temp 97.9 F (36.6 C) (Oral)   Ht 5\' 3"  (1.6 m)   Wt 158 lb (71.7 kg)   SpO2 98%   BMI 27.99 kg/m   BP Readings from Last 3 Encounters:  02/03/18 120/90  01/23/18 106/64  01/13/18 108/82    Wt Readings from Last 3 Encounters:  02/03/18 158 lb (71.7 kg)  01/23/18 156 lb 9.6 oz (71 kg)  01/13/18 158 lb (71.7 kg)   Physical Exam  HENT:  Mouth/Throat:  No trismus in the jaw. Posterior oropharyngeal erythema present. No oropharyngeal exudate, posterior oropharyngeal edema or tonsillar abscesses. Tonsils are 1+ on the right. Tonsils are 1+ on the left.  Neck: Normal range of motion. Neck supple.  Cardiovascular: Normal rate.  Pulmonary/Chest: Effort normal.  Lymphadenopathy:    She has cervical adenopathy.  Skin: No rash noted.  Psychiatric: [name]. Thought content normal. [name]. Cognition and memory are normal.  Vitals reviewed.  Lab Results  Component Value Date   WBC 11.4 (H) 12/03/2017   HGB 16.6 (H) 12/03/2017    HCT 49.6 (H) 12/03/2017   PLT 405 (H) 12/03/2017   GLUCOSE 90 12/03/2017   ALT 43 12/03/2017   AST 28 12/03/2017   NA 142 12/03/2017   K 4.0 12/03/2017   CL 103 12/03/2017   CREATININE 0.86 12/03/2017   BUN 14 12/03/2017   CO2 28 12/03/2017   TSH 1.80 11/04/2017   HGBA1C 5.7 11/04/2017    Assessment & Plan:   Jamee was seen today for pain.  Diagnoses and all orders for this visit:  Myalgia -     DULoxetine (CYMBALTA) 30 MG capsule; Take 1 capsule (30 mg total) by mouth daily.  Severe episode of recurrent major depressive disorder, without psychotic features (HCC) -     DULoxetine (CYMBALTA) 30 MG capsule; Take 1 capsule (30 mg total) by mouth daily.  Acute streptococcal pharyngitis -     POCT rapid strep A -     amoxicillin (AMOXIL) 875 MG tablet; Take 1 tablet (875 mg total) by mouth 2 (two) times daily.   I have discontinued [name] Earp "TJ"'s FLUoxetine. I am also having her start on DULoxetine and amoxicillin. Additionally, I am having her maintain her BD DISP NEEDLES, B-D 3CC LUER-LOK SYR 25GX1/2", testosterone cypionate, naproxen sodium, and Vitamin D (Ergocalciferol).  Meds ordered this encounter  Medications  . DULoxetine (CYMBALTA) 30 MG capsule    Sig: Take 1 capsule (30 mg total) by mouth daily.    Dispense:  30 capsule    Refill:  1    Order Specific Question:   Supervising Provider    Answer:   MATTHEWS, CODY [[id number]]  . amoxicillin (AMOXIL) 875 MG tablet    Sig: Take 1 tablet (875 mg total) by mouth 2 (two) times daily.    Dispense:  20 tablet    Refill:  0    Order Specific Question:   Supervising Provider    Answer:   MATTHEWS, CODY [[id number]]    Problem List Items Addressed This Visit      Other   Myalgia - Primary   Relevant Medications   DULoxetine (CYMBALTA) 30 MG capsule   Severe episode of recurrent major depressive disorder, without psychotic features (HCC)   Relevant Medications   DULoxetine (CYMBALTA) 30 MG capsule    Other Visit  Diagnoses    Acute streptococcal pharyngitis       Relevant Medications   amoxicillin (AMOXIL) 875 MG tablet   Other Relevant Orders   POCT rapid strep A (Completed)       Follow-up: Return for maintain appt 03/10/2018.  [name], NP

## 2018-02-08 DIAGNOSIS — F640 Transsexualism: Secondary | ICD-10-CM | POA: Diagnosis not present

## 2018-03-09 DIAGNOSIS — M791 Myalgia, unspecified site: Secondary | ICD-10-CM | POA: Diagnosis not present

## 2018-03-10 ENCOUNTER — Encounter: Payer: Self-pay | Admitting: Nurse Practitioner

## 2018-03-10 ENCOUNTER — Ambulatory Visit (INDEPENDENT_AMBULATORY_CARE_PROVIDER_SITE_OTHER): Payer: BLUE CROSS/BLUE SHIELD | Admitting: Nurse Practitioner

## 2018-03-10 VITALS — BP 122/88 | HR 105 | Temp 98.1°F | Ht 63.0 in | Wt 159.6 lb

## 2018-03-10 DIAGNOSIS — M791 Myalgia, unspecified site: Secondary | ICD-10-CM

## 2018-03-10 DIAGNOSIS — F332 Major depressive disorder, recurrent severe without psychotic features: Secondary | ICD-10-CM | POA: Diagnosis not present

## 2018-03-10 MED ORDER — DULOXETINE HCL 60 MG PO CPEP: 60.0000 mg | ORAL_CAPSULE | Freq: Every day | ORAL | 2 refills | Status: DC

## 2018-03-10 MED ORDER — DULOXETINE HCL 40 MG PO CPEP: 40.0000 mg | ORAL_CAPSULE | Freq: Every day | ORAL | 0 refills | Status: DC

## 2018-03-10 NOTE — Assessment & Plan Note (Signed)
mprove pain and mood with cymbalta. Dose increased to 40mg  x 2weeks, then 60mg  continuous. F/up in 90months

## 2018-03-10 NOTE — Assessment & Plan Note (Addendum)
D/c prozac, ineffective. Improve pain and mood with cymbalta. Dose increased to 40mg  x 2weeks, then 60mg  continuous. F/up 59months

## 2018-03-10 NOTE — Patient Instructions (Signed)
Increased cymbalta dose to 40mg  once a day x 2week. If no adverse effects, start cymbalta 60mg  once a day.

## 2018-03-10 NOTE — Progress Notes (Signed)
Subjective:  Patient ID: Shelia Walker, female    DOB: 04-Sep-1995  Age: 23 y.o. MRN: [id number]  CC: Follow-up (3 mo f/u)  HPI  Improved mood and pain with cymbalta. Will like dose increased. Daytime somnolence if taken during the day. Depression screen [facility] 2/9 03/10/2018 11/04/2017 11/04/2017  Decreased Interest 1 2 1   Down, Depressed, Hopeless 1 2 1   PHQ - 2 Score 2 4 2   Altered sleeping 1 1 -  Tired, decreased energy 1 2 -  Change in appetite 0 1 -  Feeling bad or failure about yourself  1 1 -  Trouble concentrating 1 2 -  Moving slowly or fidgety/restless 0 0 -  Suicidal thoughts 0 1 -  PHQ-9 Score 6 12 -   GAD 7 : Generalized Anxiety Score 03/10/2018 11/04/2017  Nervous, Anxious, on Edge 1 1  Control/stop worrying 0 1  Worry too much - different things 0 2  Trouble relaxing 0 3  Restless 0 1  Easily annoyed or irritable 0 1  Afraid - awful might happen 0 0  Total GAD 7 Score 1 9   Reviewed past Medical, Social and Family history today.  Outpatient Medications Prior to Visit  Medication Sig Dispense Refill  . naproxen sodium (ALEVE) 220 MG tablet Take 880 mg by mouth daily as needed (pain).    . DULoxetine (CYMBALTA) 30 MG capsule Take 1 capsule (30 mg total) by mouth daily. 30 capsule 1  . amoxicillin (AMOXIL) 875 MG tablet Take 1 tablet (875 mg total) by mouth 2 (two) times daily. (Patient not taking: Reported on 03/10/2018) 20 tablet 0  . B-D 3CC LUER-LOK SYR 25GX1/2" 25G X 1-1/2" 3 ML MISC INJECT 1 ML Q OTHER WEEK INTO MUSCLE UTD  1  . BD DISP NEEDLES 22G X 1-1/2" MISC USE UTD  1  . testosterone cypionate (DEPOTESTOSTERONE CYPIONATE) 200 MG/ML injection Inject 200 mg into the muscle every 14 (fourteen) days. Has not injected in 3 weeks  0  . Vitamin D, Ergocalciferol, (DRISDOL) 50000 units CAPS capsule Take 1 capsule (50,000 Units total) by mouth every 7 (seven) days. (Patient taking differently: Take 50,000 Units by mouth every 7 (seven) days. Has not started) 12 capsule 0    No facility-administered medications prior to visit.     ROS See HPI  Objective:  BP 122/88   Pulse (!) 105   Temp 98.1 F (36.7 C) (Oral)   Ht 5\' 3"  (1.6 m)   Wt 159 lb 9.6 oz (72.4 kg)   SpO2 97%   BMI 28.27 kg/m   BP Readings from Last 3 Encounters:  03/10/18 122/88  02/03/18 120/90  01/23/18 106/64    Wt Readings from Last 3 Encounters:  03/10/18 159 lb 9.6 oz (72.4 kg)  02/03/18 158 lb (71.7 kg)  01/23/18 156 lb 9.6 oz (71 kg)    Physical Exam Vitals signs reviewed.  Neurological:     Mental Status: She is alert.  Psychiatric:        Attention and Perception: Attention normal.        Mood and Affect: Mood and affect normal.        Speech: Speech normal.        Behavior: Behavior normal.        Thought Content: Thought content normal.        Cognition and Memory: Cognition and memory normal.        Judgment: Judgment normal.     Lab Results  Component Value Date   WBC 11.4 (H) 12/03/2017   HGB 16.6 (H) 12/03/2017   HCT 49.6 (H) 12/03/2017   PLT 405 (H) 12/03/2017   GLUCOSE 90 12/03/2017   ALT 43 12/03/2017   AST 28 12/03/2017   NA 142 12/03/2017   K 4.0 12/03/2017   CL 103 12/03/2017   CREATININE 0.86 12/03/2017   BUN 14 12/03/2017   CO2 28 12/03/2017   TSH 1.80 11/04/2017   HGBA1C 5.7 11/04/2017    Assessment & Plan:   Shelia Walker was seen today for follow-up.  Diagnoses and all orders for this visit:  Severe episode of recurrent major depressive disorder, without psychotic features (HCC) -     DULoxetine 40 MG CPEP; Take 40 mg by mouth daily. -     Discontinue: DULoxetine (CYMBALTA) 60 MG capsule; Take 1 capsule (60 mg total) by mouth daily. -     DULoxetine (CYMBALTA) 60 MG capsule; Take 1 capsule (60 mg total) by mouth daily.  Myalgia -     DULoxetine 40 MG CPEP; Take 40 mg by mouth daily. -     Discontinue: DULoxetine (CYMBALTA) 60 MG capsule; Take 1 capsule (60 mg total) by mouth daily. -     DULoxetine (CYMBALTA) 60 MG capsule;  Take 1 capsule (60 mg total) by mouth daily.   I have changed Shelia Taras "TJ"'s DULoxetine to DULoxetine HCl. I am also having her maintain her BD DISP NEEDLES, B-D 3CC LUER-LOK SYR 25GX1/2", testosterone cypionate, naproxen sodium, Vitamin D (Ergocalciferol), amoxicillin, and DULoxetine.  Meds ordered this encounter  Medications  . DULoxetine 40 MG CPEP    Sig: Take 40 mg by mouth daily.    Dispense:  14 capsule    Refill:  0    Order Specific Question:   Supervising Provider    Answer:   MATTHEWS, CODY [[id number]]  . DISCONTD: DULoxetine (CYMBALTA) 60 MG capsule    Sig: Take 1 capsule (60 mg total) by mouth daily.    Dispense:  30 capsule    Refill:  2    Order Specific Question:   Supervising Provider    Answer:   MATTHEWS, CODY [[id number]]  . DULoxetine (CYMBALTA) 60 MG capsule    Sig: Take 1 capsule (60 mg total) by mouth daily.    Dispense:  30 capsule    Refill:  2    Do not fill before 03/24/2017    Order Specific Question:   Supervising Provider    Answer:   Everrett Coombe [[id number]]    Problem List Items Addressed This Visit      Other   Myalgia    mprove pain and mood with cymbalta. Dose increased to 40mg  x 2weeks, then 60mg  continuous. F/up in 67months      Relevant Medications   DULoxetine 40 MG CPEP   DULoxetine (CYMBALTA) 60 MG capsule (Start on 03/25/2018)   Severe episode of recurrent major depressive disorder, without psychotic features (HCC) - Primary    D/c prozac, ineffective. Improve pain and mood with cymbalta. Dose increased to 40mg  x 2weeks, then 60mg  continuous. F/up 50months      Relevant Medications   DULoxetine 40 MG CPEP   DULoxetine (CYMBALTA) 60 MG capsule (Start on 03/25/2018)       Follow-up: Return in about 3 months (around 06/09/2018) for CPE and depression ( ).  [name], NP

## 2018-03-13 ENCOUNTER — Telehealth: Payer: Self-pay | Admitting: Nurse Practitioner

## 2018-03-13 DIAGNOSIS — F332 Major depressive disorder, recurrent severe without psychotic features: Secondary | ICD-10-CM

## 2018-03-13 DIAGNOSIS — M791 Myalgia, unspecified site: Secondary | ICD-10-CM

## 2018-03-13 NOTE — Telephone Encounter (Signed)
PA for Duloxine DR 40 mg started. Waiting for result.

## 2018-03-13 NOTE — Telephone Encounter (Signed)
BIN: [id number]  PCN: [id number]  ID: [id number]

## 2018-03-14 MED ORDER — DULOXETINE HCL 20 MG PO CPEP: 40.0000 mg | ORAL_CAPSULE | Freq: Every day | ORAL | 0 refills | Status: DC

## 2018-03-14 NOTE — Telephone Encounter (Signed)
Pt is aware.  

## 2018-03-14 NOTE — Telephone Encounter (Signed)
Left pt a VM to call back, need to inform pt about rx

## 2018-03-14 NOTE — Telephone Encounter (Signed)
Sent cymbalta 20mg , take 2tabs once a day x 2weeks, then switch to 60mg  once a day if no adverse side effects

## 2018-03-14 NOTE — Telephone Encounter (Signed)
PA was denied by insurance. Alternative medication include: duloxetine 20 mg or 30 mg, sertraline, citalopram. Please advise

## 2018-03-16 ENCOUNTER — Other Ambulatory Visit: Payer: Self-pay | Admitting: Rheumatology

## 2018-03-16 NOTE — Telephone Encounter (Signed)
Patient will need labs prior to refill.

## 2018-03-28 DIAGNOSIS — F640 Transsexualism: Secondary | ICD-10-CM | POA: Diagnosis not present

## 2018-04-13 DIAGNOSIS — M791 Myalgia, unspecified site: Secondary | ICD-10-CM | POA: Diagnosis not present

## 2018-04-17 ENCOUNTER — Encounter: Payer: Self-pay | Admitting: Nurse Practitioner

## 2018-04-26 DIAGNOSIS — F640 Transsexualism: Secondary | ICD-10-CM | POA: Diagnosis not present

## 2018-05-04 ENCOUNTER — Ambulatory Visit (INDEPENDENT_AMBULATORY_CARE_PROVIDER_SITE_OTHER): Payer: BLUE CROSS/BLUE SHIELD | Admitting: Nurse Practitioner

## 2018-05-04 ENCOUNTER — Encounter: Payer: Self-pay | Admitting: Nurse Practitioner

## 2018-05-04 VITALS — BP 92/64 | HR 90 | Temp 98.1°F | Ht 63.0 in | Wt 164.8 lb

## 2018-05-04 DIAGNOSIS — E559 Vitamin D deficiency, unspecified: Secondary | ICD-10-CM | POA: Insufficient documentation

## 2018-05-04 DIAGNOSIS — F332 Major depressive disorder, recurrent severe without psychotic features: Secondary | ICD-10-CM

## 2018-05-04 DIAGNOSIS — M791 Myalgia, unspecified site: Secondary | ICD-10-CM

## 2018-05-04 LAB — BASIC METABOLIC PANEL
BUN: 14 mg/dL (ref 6–23)
CO2: 32 mEq/L (ref 19–32)
Calcium: 9.7 mg/dL (ref 8.4–10.5)
Chloride: 103 mEq/L (ref 96–112)
Creatinine, Ser: 0.78 mg/dL (ref 0.40–1.20)
GFR: 92.29 mL/min (ref >60.00)
Glucose, Bld: 101 mg/dL — ABNORMAL HIGH (ref 70–99)
Potassium: 4.6 mEq/L (ref 3.5–5.1)
Sodium: 140 mEq/L (ref 135–145)

## 2018-05-04 MED ORDER — PREGABALIN 25 MG PO CAPS: ORAL_CAPSULE | ORAL | 1 refills | Status: DC

## 2018-05-04 NOTE — Progress Notes (Signed)
Subjective:  Patient ID: Shelia Walker, female    DOB: Oct 06, 1995  Age: 23 y.o. MRN: [id number]  CC: Follow-up (pain manag Rx not working, [facility] consult)  HPI Chronic pain: generalized muscle pain Also followed by [facility] neurology: Elavil prescribed, no improvement and cause mood swings. No improvement with cymbalta 60mg . Reports mood has been stable except during the period elavil was used. Previous use of prozac for mood without any adverse effects. She will like to resume medication. Depression screen [facility] 2/9 03/10/2018 11/04/2017 11/04/2017  Decreased Interest 1 2 1   Down, Depressed, Hopeless 1 2 1   PHQ - 2 Score 2 4 2   Altered sleeping 1 1 -  Tired, decreased energy 1 2 -  Change in appetite 0 1 -  Feeling bad or failure about yourself  1 1 -  Trouble concentrating 1 2 -  Moving slowly or fidgety/restless 0 0 -  Suicidal thoughts 0 1 -  PHQ-9 Score 6 12 -   GAD 7 : Generalized Anxiety Score 03/10/2018 11/04/2017  Nervous, Anxious, on Edge 1 1  Control/stop worrying 0 1  Worry too much - different things 0 2  Trouble relaxing 0 3  Restless 0 1  Easily annoyed or irritable 0 1  Afraid - awful might happen 0 0  Total GAD 7 Score 1 9    Reviewed past Medical, Social and Family history today.  Outpatient Medications Prior to Visit  Medication Sig Dispense Refill  . amoxicillin (AMOXIL) 875 MG tablet Take 1 tablet (875 mg total) by mouth 2 (two) times daily. 20 tablet 0  . B-D 3CC LUER-LOK SYR 25GX1/2" 25G X 1-1/2" 3 ML MISC INJECT 1 ML Q OTHER WEEK INTO MUSCLE UTD  1  . BD DISP NEEDLES 22G X 1-1/2" MISC USE UTD  1  . testosterone cypionate (DEPOTESTOSTERONE CYPIONATE) 200 MG/ML injection Inject 200 mg into the muscle every 14 (fourteen) days. Has not injected in 3 weeks  0  . Vitamin D, Ergocalciferol, (DRISDOL) 50000 units CAPS capsule Take 1 capsule (50,000 Units total) by mouth every 7 (seven) days. (Patient taking differently: Take 50,000 Units by mouth every 7 (seven)  days. Has not started) 12 capsule 0  . DULoxetine (CYMBALTA) 60 MG capsule Take 1 capsule (60 mg total) by mouth daily. 30 capsule 2  . naproxen sodium (ALEVE) 220 MG tablet Take 880 mg by mouth daily as needed (pain).    Marland Kitchen FLUoxetine (PROZAC) 20 MG tablet Take 1 tablet (20 mg total) by mouth daily. 30 tablet 3  . DULoxetine (CYMBALTA) 20 MG capsule Take 2 capsules (40 mg total) by mouth daily. (Patient not taking: Reported on 05/04/2018) 28 capsule 0   No facility-administered medications prior to visit.     ROS See HPI  Objective:  BP 92/64   Pulse 90   Temp 98.1 F (36.7 C) (Oral)   Ht 5\' 3"  (1.6 m)   Wt 164 lb 12.8 oz (74.8 kg)   SpO2 99%   BMI 29.19 kg/m   BP Readings from Last 3 Encounters:  05/04/18 92/64  03/10/18 122/88  02/03/18 120/90    Wt Readings from Last 3 Encounters:  05/04/18 164 lb 12.8 oz (74.8 kg)  03/10/18 159 lb 9.6 oz (72.4 kg)  02/03/18 158 lb (71.7 kg)    Physical Exam Vitals signs reviewed.  Cardiovascular:     Rate and Rhythm: Normal rate.     Pulses: Normal pulses.  Pulmonary:     Effort: Pulmonary  effort is normal.  Musculoskeletal:     Right lower leg: No edema.     Left lower leg: No edema.  Neurological:     Mental Status: She is alert and oriented to person, place, and time.     Comments: Ambulates with cane  Psychiatric:        Mood and Affect: Mood normal.        Behavior: Behavior normal.        Thought Content: Thought content normal.     Lab Results  Component Value Date   WBC 11.4 (H) 12/03/2017   HGB 16.6 (H) 12/03/2017   HCT 49.6 (H) 12/03/2017   PLT 405 (H) 12/03/2017   GLUCOSE 90 12/03/2017   ALT 43 12/03/2017   AST 28 12/03/2017   NA 142 12/03/2017   K 4.0 12/03/2017   CL 103 12/03/2017   CREATININE 0.86 12/03/2017   BUN 14 12/03/2017   CO2 28 12/03/2017   TSH 1.80 11/04/2017   HGBA1C 5.7 11/04/2017   Assessment & Plan:   Shelia Walker was seen today for follow-up.  Diagnoses and all orders for this  visit:  Myalgia -     pregabalin (LYRICA) 25 MG capsule; Take 1cap at bedtime x 1week, then increase to 1cap morning and bedtime continuous -     Basic metabolic panel  Severe episode of recurrent major depressive disorder, without psychotic features (HCC) -     pregabalin (LYRICA) 25 MG capsule; Take 1cap at bedtime x 1week, then increase to 1cap morning and bedtime continuous -     Basic metabolic panel  Vitamin D deficiency -     Vitamin D 1,25 dihydroxy   I have discontinued Shelia Walker "TJ"'s naproxen sodium, DULoxetine, and DULoxetine. I am also having her start on pregabalin. Additionally, I am having her maintain her BD DISP NEEDLES, B-D 3CC LUER-LOK SYR 25GX1/2", testosterone cypionate, Vitamin D (Ergocalciferol), amoxicillin, and FLUoxetine.  Meds ordered this encounter  Medications  . pregabalin (LYRICA) 25 MG capsule    Sig: Take 1cap at bedtime x 1week, then increase to 1cap morning and bedtime continuous    Dispense:  60 capsule    Refill:  1    Order Specific Question:   Supervising Provider    Answer:   Dianne Dun [[id number]]    Problem List Items Addressed This Visit      Other   Myalgia - Primary    No improvement with cymbalta 20mg  and unable to tolerate Elavil. Stop cymbalta. Start lyrica. F/up in 34month. conside use of Savella or gabapentin if no improvement?      Relevant Medications   FLUoxetine (PROZAC) 20 MG tablet   pregabalin (LYRICA) 25 MG capsule   Other Relevant Orders   Basic metabolic panel   Severe episode of recurrent major depressive disorder, without psychotic features (HCC)    Resume prozac 20mg       Relevant Medications   FLUoxetine (PROZAC) 20 MG tablet   pregabalin (LYRICA) 25 MG capsule   Other Relevant Orders   Basic metabolic panel   Vitamin D deficiency   Relevant Orders   Vitamin D 1,25 dihydroxy       Follow-up: Return in about 4 weeks (around 06/01/2018) for depression and myalgia.  [name], NP

## 2018-05-04 NOTE — Assessment & Plan Note (Signed)
No improvement with cymbalta 20mg  and unable to tolerate Elavil. Stop cymbalta. Start lyrica. F/up in 56month. conside use of Savella or gabapentin if no improvement?

## 2018-05-04 NOTE — Patient Instructions (Addendum)
Stop cymbalta. Resume prozac. Start lyrica as instructed.  Go to lab for blood draw.

## 2018-05-04 NOTE — Assessment & Plan Note (Signed)
Resume prozac 20mg 

## 2018-05-05 ENCOUNTER — Telehealth: Payer: Self-pay

## 2018-05-05 NOTE — Telephone Encounter (Signed)
-----   Message from Anne Ng, NP sent at 05/05/2018  1:07 PM EST ----- normal

## 2018-05-05 NOTE — Telephone Encounter (Signed)
Pt is aware of results. Pt did have a question about titration of new Rx Lyrica 25 Mg. Read back to Pt the directions as prescribed, " 1 Capsule at bed time for first week , then 1 Capsule in AM and 1 Capsule in PM ( bedtime) . Directions were clarified. Pt voiced she understood.

## 2018-05-09 DIAGNOSIS — Z113 Encounter for screening for infections with a predominantly sexual mode of transmission: Secondary | ICD-10-CM | POA: Diagnosis not present

## 2018-05-09 DIAGNOSIS — Z79899 Other long term (current) drug therapy: Secondary | ICD-10-CM | POA: Diagnosis not present

## 2018-05-09 DIAGNOSIS — Z114 Encounter for screening for human immunodeficiency virus [HIV]: Secondary | ICD-10-CM | POA: Diagnosis not present

## 2018-05-09 DIAGNOSIS — F640 Transsexualism: Secondary | ICD-10-CM | POA: Diagnosis not present

## 2018-05-10 DIAGNOSIS — F640 Transsexualism: Secondary | ICD-10-CM | POA: Diagnosis not present

## 2018-05-11 LAB — VITAMIN D 1,25 DIHYDROXY
Vitamin D 1, 25 (OH)2 Total: 59 pg/mL (ref 18–72)
Vitamin D2 1, 25 (OH)2: 36 pg/mL
Vitamin D3 1, 25 (OH)2: 23 pg/mL

## 2018-05-24 DIAGNOSIS — F640 Transsexualism: Secondary | ICD-10-CM | POA: Diagnosis not present

## 2018-06-01 ENCOUNTER — Telehealth: Payer: Self-pay

## 2018-06-01 ENCOUNTER — Encounter: Payer: Self-pay | Admitting: Nurse Practitioner

## 2018-06-01 ENCOUNTER — Ambulatory Visit (INDEPENDENT_AMBULATORY_CARE_PROVIDER_SITE_OTHER): Payer: BLUE CROSS/BLUE SHIELD | Admitting: Nurse Practitioner

## 2018-06-01 VITALS — Temp 97.9°F | Ht 63.0 in | Wt 165.6 lb

## 2018-06-01 DIAGNOSIS — M25561 Pain in right knee: Secondary | ICD-10-CM | POA: Diagnosis not present

## 2018-06-01 NOTE — Telephone Encounter (Signed)
Copied from CRM [id number]. Topic: General - Other >> Jun 01, 2018  9:43 AM Gwenlyn Fudge A wrote: Reason for CRM: Pt called and said she is experiencing severe knee pain. Pt states the right knee pain is greater, but she is experiencing significant pain in her left knee as well. Please contact pt and advise.

## 2018-06-01 NOTE — Progress Notes (Addendum)
Virtual Visit via Video Note  I connected with Shelia Walker on 06/01/18 at  2:30 PM EDT by a video enabled telemedicine application and verified that I am speaking with the correct person using two identifiers.   I discussed the limitations of evaluation and management by telemedicine and the availability of in person appointments. The patient expressed understanding and agreed to proceed.  History of Present Illness: pt c/o of knee pain (both but right is more than Left)/started 1 day but pt report fall 1 wk ago/tried pain relieve cream--help a little. Knee Pain   The incident occurred 2 days ago. There was no injury mechanism. The pain is present in the right knee. The quality of the pain is described as aching. The pain is severe. The pain has been constant since onset. Associated symptoms include an inability to bear weight. Pertinent negatives include no loss of motion, loss of sensation, muscle weakness, numbness or tingling. The symptoms are aggravated by palpation and weight bearing. She has tried immobilization and rest (toical anesthetic cream) for the symptoms. The treatment provided mild relief.  fall last week on buttocks, does not remember any injury to knee.   Observations/Objective: Reviewed vital signs provided by patient. Physical Exam  Constitutional: [name], place, and time. No distress.  Pulmonary/Chest: Effort normal.  Musculoskeletal:     Right shoulder: She exhibits normal range of motion.     Right elbow: She exhibits normal range of motion and no swelling.     Right wrist: She exhibits normal range of motion and no swelling.     Right knee: She exhibits swelling. She exhibits normal range of motion and no erythema.  Neurological: She is alert and oriented to person, place, and time.  Vitals reviewed.  Assessment and Plan: Shelia Walker was seen today for knee pain.  Diagnoses and all orders for this visit:  Acute pain of right knee   Follow  Up Instructions: F/up in office tomorrow for physical exam of right knee. Use naproxen or tylenol for pain.   I discussed the assessment and treatment plan with the patient. The patient was provided an opportunity to ask questions and all were answered. The patient agreed with the plan and demonstrated an understanding of the instructions.   The patient was advised to call back or seek an in-person evaluation if the symptoms worsen or if the condition fails to improve as anticipated.   Shelia Penna, NP

## 2018-06-01 NOTE — Telephone Encounter (Signed)
appt made with nche today.

## 2018-06-02 ENCOUNTER — Ambulatory Visit (INDEPENDENT_AMBULATORY_CARE_PROVIDER_SITE_OTHER): Payer: BLUE CROSS/BLUE SHIELD | Admitting: Nurse Practitioner

## 2018-06-02 ENCOUNTER — Other Ambulatory Visit: Payer: Self-pay

## 2018-06-02 ENCOUNTER — Encounter: Payer: Self-pay | Admitting: Nurse Practitioner

## 2018-06-02 VITALS — BP 112/70 | HR 78 | Temp 98.4°F | Ht 63.0 in | Wt 165.6 lb

## 2018-06-02 DIAGNOSIS — M25561 Pain in right knee: Secondary | ICD-10-CM

## 2018-06-02 LAB — URIC ACID: Uric Acid, Serum: 4.9 mg/dL (ref 2.4–7.0)

## 2018-06-02 LAB — CBC
HCT: 43.2 % (ref 36.0–46.0)
Hemoglobin: 14.6 g/dL (ref 12.0–15.0)
MCHC: 33.8 g/dL (ref 30.0–36.0)
MCV: 85.3 fl (ref 78.0–100.0)
Platelets: 359 10*3/uL (ref 150.0–400.0)
RBC: 5.07 Mil/uL (ref 3.87–5.11)
RDW: 12.8 % (ref 11.5–15.5)
WBC: 6.1 10*3/uL (ref 4.0–10.5)

## 2018-06-02 LAB — SEDIMENTATION RATE: Sed Rate: 27 mm/hr — ABNORMAL HIGH (ref 0–20)

## 2018-06-02 MED ORDER — INDOMETHACIN 25 MG PO CAPS: 25.0000 mg | ORAL_CAPSULE | Freq: Three times a day (TID) | ORAL | 0 refills | Status: DC | PRN

## 2018-06-02 NOTE — Progress Notes (Signed)
Subjective:  Patient ID: Shelia Walker, female    DOB: 09-Sep-1995  Age: 23 y.o. MRN: [id number]  CC: Knee Pain (pt is c/o of right knee swollen and painful,joints pain on right extremity/going on 2 days/tried pain relieve cream otc. )  Knee Pain   The incident occurred 2 days ago. There was no injury mechanism. The pain is present in the right knee. The quality of the pain is described as aching. The pain is at a severity of 9/10. The pain is severe. The pain has been constant since onset. Associated symptoms include an inability to bear weight. Pertinent negatives include no loss of motion, loss of sensation, muscle weakness, numbness or tingling. The symptoms are aggravated by movement, palpation and weight bearing. She has tried rest for the symptoms. The treatment provided no relief.   Reviewed past Medical, Social and Family history today.  Outpatient Medications Prior to Visit  Medication Sig Dispense Refill  . B-D 3CC LUER-LOK SYR 25GX1/2" 25G X 1-1/2" 3 ML MISC INJECT 1 ML Q OTHER WEEK INTO MUSCLE UTD  1  . BD DISP NEEDLES 22G X 1-1/2" MISC USE UTD  1  . FLUoxetine (PROZAC) 20 MG tablet Take 1 tablet (20 mg total) by mouth daily. 30 tablet 3  . pregabalin (LYRICA) 25 MG capsule Take 1cap at bedtime x 1week, then increase to 1cap morning and bedtime continuous 60 capsule 1  . testosterone cypionate (DEPOTESTOSTERONE CYPIONATE) 200 MG/ML injection Inject 200 mg into the muscle every 14 (fourteen) days. Has not injected in 3 weeks  0   No facility-administered medications prior to visit.     ROS See HPI  Objective:  BP 112/70   Pulse 78   Temp 98.4 F (36.9 C) (Oral)   Ht 5\' 3"  (1.6 m)   Wt 165 lb 9.6 oz (75.1 kg)   SpO2 99%   BMI 29.33 kg/m   BP Readings from Last 3 Encounters:  06/02/18 112/70  05/04/18 92/64  03/10/18 122/88    Wt Readings from Last 3 Encounters:  06/02/18 165 lb 9.6 oz (75.1 kg)  06/01/18 165 lb 9.6 oz (75.1 kg)  05/04/18 164 lb 12.8 oz (74.8  kg)    Physical Exam Vitals signs reviewed.  Musculoskeletal:        General: Tenderness present. No deformity.     Right knee: She exhibits swelling. She exhibits no effusion, no erythema, normal patellar mobility and no bony tenderness. Tenderness found. No medial joint line, no lateral joint line, no MCL, no LCL and no patellar tendon tenderness noted.     Left knee: Normal.     Right lower leg: No edema.     Left lower leg: No edema.     Comments: Crepitus in right knee. Diffuse tenderness of right knee with palpation  Neurological:     Mental Status: She is alert and oriented to person, place, and time.  Psychiatric:        Mood and Affect: Mood normal.        Behavior: Behavior normal.     Lab Results  Component Value Date   WBC 11.4 (H) 12/03/2017   HGB 16.6 (H) 12/03/2017   HCT 49.6 (H) 12/03/2017   PLT 405 (H) 12/03/2017   GLUCOSE 101 (H) 05/04/2018   ALT 43 12/03/2017   AST 28 12/03/2017   NA 140 05/04/2018   K 4.6 05/04/2018   CL 103 05/04/2018   CREATININE 0.78 05/04/2018   BUN 14 05/04/2018  CO2 32 05/04/2018   TSH 1.80 11/04/2017   HGBA1C 5.7 11/04/2017    Assessment & Plan:   Shelia Walker was seen today for knee pain.  Diagnoses and all orders for this visit:  Acute pain of right knee -     Sedimentation rate -     Uric acid -     CBC -     indomethacin (INDOCIN) 25 MG capsule; Take 1 capsule (25 mg total) by mouth 3 (three) times daily as needed. With food   I am having Shelia Puig "TJ" start on indomethacin. I am also having her maintain her BD Disp Needles, B-D 3CC LUER-LOK SYR 25GX1/2", testosterone cypionate, FLUoxetine, and pregabalin.  Meds ordered this encounter  Medications  . indomethacin (INDOCIN) 25 MG capsule    Sig: Take 1 capsule (25 mg total) by mouth 3 (three) times daily as needed. With food    Dispense:  20 capsule    Refill:  0    Order Specific Question:   Supervising Provider    Answer:   MATTHEWS, CODY [[id number]]     Problem List Items Addressed This Visit    None    Visit Diagnoses    Acute pain of right knee    -  Primary   Relevant Medications   indomethacin (INDOCIN) 25 MG capsule   Other Relevant Orders   Sedimentation rate   Uric acid   CBC       Follow-up: No follow-ups on file.  [name], NP

## 2018-06-02 NOTE — Patient Instructions (Addendum)
Go to lab for blood draw.  You may also use knee sleeve during the day and off at night.  Alternate between warm and cold compress as needed.  Call office if no improvement in 1week. Will refer to sport medicine.   Acute Knee Pain, Adult Many things can cause knee pain. Sometimes, knee pain is sudden (acute) and may be caused by damage, swelling, or irritation of the muscles and tissues that support your knee. The pain often goes away on its own with time and rest. If the pain does not go away, tests may be done to find out what is causing the pain. Follow these instructions at home: Pay attention to any changes in your symptoms. Take these actions to relieve your pain. If you have a knee sleeve or brace:   Wear the sleeve or brace as told by your doctor. Remove it only as told by your doctor.  Loosen the sleeve or brace if your toes: ? Tingle. ? Become numb. ? Turn cold and blue.  Keep the sleeve or brace clean.  If the sleeve or brace is not waterproof: ? Do not let it get wet. ? Cover it with a watertight covering when you take a bath or shower. Activity  Rest your knee.  Do not do things that cause pain.  Avoid activities where both feet leave the ground at the same time (high-impact activities). Examples are running, jumping rope, and doing jumping jacks.  Work with a physical therapist to make a safe exercise program, as told by your doctor. Managing pain, stiffness, and swelling   If told, put ice on the knee: ? Put ice in a plastic bag. ? Place a towel between your skin and the bag. ? Leave the ice on for 20 minutes, 2-3 times a day.  If told, put pressure (compression) on your injured knee to control swelling, give support, and help with discomfort. Compression may be done with an elastic bandage. General instructions  Take all medicines only as told by your doctor.  Raise (elevate) your knee while you are sitting or lying down. Make sure your knee is  higher than your heart.  Sleep with a pillow under your knee.  Do not use any products that contain nicotine or tobacco. These include cigarettes, e-cigarettes, and chewing tobacco. These products may slow down healing. If you need help quitting, ask your doctor.  If you are overweight, work with your doctor and a food expert (dietitian) to set goals to lose weight. Being overweight can make your knee hurt more.  Keep all follow-up visits as told by your doctor. This is important. Contact a doctor if:  The knee pain does not stop.  The knee pain changes or gets worse.  You have a fever along with knee pain.  Your knee feels warm when you touch it.  Your knee gives out or locks up. Get help right away if:  Your knee swells, and the swelling gets worse.  You cannot move your knee.  You have very bad knee pain. Summary  Many things can cause knee pain. The pain often goes away on its own with time and rest.  Your doctor may do tests to find out the cause of the pain.  Pay attention to any changes in your symptoms. Relieve your pain with rest, medicines, light activity, and use of ice.  Get help right away if you cannot move your knee or your knee pain is very bad. This information is  not intended to replace advice given to you by your health care provider. Make sure you discuss any questions you have with your health care provider. Document Released: 05/14/2008 Document Revised: 07/28/2017 Document Reviewed: 07/28/2017 Elsevier Interactive Patient Education  2019 [employer].

## 2018-06-06 ENCOUNTER — Encounter: Payer: Self-pay | Admitting: Family Medicine

## 2018-06-06 ENCOUNTER — Ambulatory Visit (INDEPENDENT_AMBULATORY_CARE_PROVIDER_SITE_OTHER): Payer: BLUE CROSS/BLUE SHIELD

## 2018-06-06 ENCOUNTER — Other Ambulatory Visit: Payer: Self-pay

## 2018-06-06 ENCOUNTER — Ambulatory Visit (INDEPENDENT_AMBULATORY_CARE_PROVIDER_SITE_OTHER): Payer: BLUE CROSS/BLUE SHIELD | Admitting: Family Medicine

## 2018-06-06 VITALS — BP 110/78 | HR 88 | Temp 98.1°F | Ht 63.0 in

## 2018-06-06 DIAGNOSIS — M25561 Pain in right knee: Secondary | ICD-10-CM | POA: Diagnosis not present

## 2018-06-06 MED ORDER — PREDNISONE 5 MG PO TBEC: 5.0000 mg | DELAYED_RELEASE_TABLET | Freq: Every day | ORAL | 0 refills | Status: DC

## 2018-06-06 NOTE — Progress Notes (Signed)
Shelia Walker - 23 y.o. female MRN [id number]  Date of birth: 14-May-1995  SUBJECTIVE:  Including CC & ROS.  Chief Complaint  Patient presents with  . Pain    right knee pain, few days/ seen by charlotte given indocin/ pt fell before her knee started hurting but doesn't think it corrolates     Shelia Walker is a 23 y.o. female that is presenting with right knee pain.  The pain is ongoing for a couple weeks.  Had a fall but did not land on the knee.  Having pain over the medial femoral condyle.  Denies any redness or swelling.  Denies any mechanical symptoms.  Does have severe pain upon ambulation.  Was prescribed indomethacin with limited improvement.  The pain is severe.  Seems to be worse with the procedure.  Denies any history of surgery.  Also has some pain in joints in the right elbow and wrist.  Has not been on the thumb pain over 6 months.  Having work-up for myalgias by the rheumatologist.  Has had an elevated ANA and CK.  Has not been starting any new or different exercises..    Review of Systems  Constitutional: Negative for fever.  HENT: Negative for congestion.   Respiratory: Negative for cough.   Cardiovascular: Negative for chest pain.  Gastrointestinal: Negative for abdominal pain.  Musculoskeletal: Positive for gait problem.  Skin: Negative for color change.  Neurological: Positive for seizures.  Hematological: Negative for adenopathy.  Psychiatric/Behavioral: Negative for agitation.    HISTORY: Past Medical, Surgical, Social, and Family History Reviewed & Updated per EMR.   Pertinent Historical Findings include:  Past Medical History:  Diagnosis Date  . Anxiety   . Depression   . Migraines   . PTSD (post-traumatic stress disorder)   . Transgender     Past Surgical History:  Procedure Laterality Date  . WISDOM TOOTH EXTRACTION      Allergies  Allergen Reactions  . Ginger Anaphylaxis  . Pyridium [Phenazopyridine Hcl] Nausea And Vomiting    headache    Family History  Problem Relation Age of Onset  . Fibromyalgia Mother   . Cerebral aneurysm Mother   . Hyperlipidemia Mother   . Hypertension Mother   . Stroke Mother        x3, first stroke age 70, third stroke 2013  . Depression Mother   . Autoimmune disease Mother   . Depression Father   . Fibromyalgia Maternal Grandmother   . Arthritis Maternal Grandmother   . Diabetes Maternal Grandmother   . Depression Maternal Grandmother   . Depression Sister   . Learning disabilities Sister   . Mental illness Sister   . Healthy Brother      Social History   Socioeconomic History  . Marital status: Single    Spouse name: Not on file  . Number of children: Not on file  . Years of education: Not on file  . Highest education level: Some college, no degree  Occupational History  . Occupation: Consulting civil engineer  Social Needs  . Financial resource strain: Not on file  . Food insecurity:    Worry: Not on file    Inability: Not on file  . Transportation needs:    Medical: Not on file    Non-medical: Not on file  Tobacco Use  . Smoking status: Former Smoker    Packs/day: 0.10    Years: 5.00    Pack years: 0.50    Types: Cigarettes    Last attempt to  quit: 05/2017    Years since quitting: 1.0  . Smokeless tobacco: Never Used  . Tobacco comment: 3 cigs daily  Substance and Sexual Activity  . Alcohol use: Yes    Comment: social  . Drug use: Yes    Types: Marijuana    Comment: social  . Sexual activity: Not on file  Lifestyle  . Physical activity:    Days per week: Not on file    Minutes per session: Not on file  . Stress: Not on file  Relationships  . Social connections:    Talks on phone: Not on file    Gets together: Not on file    Attends religious service: Not on file    Active member of club or organization: Not on file    Attends meetings of clubs or organizations: Not on file    Relationship status: Not on file  . Intimate partner violence:    Fear of current or ex  partner: Not on file    Emotionally abused: Not on file    Physically abused: Not on file    Forced sexual activity: Not on file  Other Topics Concern  . Not on file  Social History Narrative   Patient is right-handed. The patient lives with her same sex partner and 2 other roommates in a 2 story house. She occasionally drinks coffee and tea, and has a soda 3-4 x a week. She does not exercise     PHYSICAL EXAM:  VS: BP 110/78   Pulse 88   Temp 98.1 F (36.7 C) (Oral)   Ht 5\' 3"  (1.6 m)   SpO2 98%   BMI 29.33 kg/m  Physical Exam Gen: NAD, alert, cooperative with exam, well-appearing ENT: normal lips, normal nasal mucosa,  Eye: normal EOM, normal conjunctiva and lids CV:  no edema, +2 pedal pulses   Resp: no accessory muscle use, non-labored,  Skin: no rashes, no areas of induration  Neuro: normal tone, normal sensation to touch Psych:  normal insight, alert and oriented MSK:  Right knee: No effusion or redness. Normal range of motion passively. Tenderness palpation of the medial femoral condyle. Normal strength to resistance No instability with valgus or varus stress testing. Minimal pain with patellar grind. No pain with McMurray's testing. Neurovascular intact.  Limited ultrasound: Right knee:  No effusion. Normal appearing quadricep and patellar tendon. Normal-appearing medial joint line and meniscus.   Summary: No source identified is predisposing the pain.  Ultrasound and interpretation by Clare GandyJeremy Kion Huntsberry, MD      ASSESSMENT & PLAN:   Acute pain of right knee The joint shows no effusion or structural abnormalities. Unlikely to be gout as no testosterone. Possible to be related to the positive ANA.  - rayos for 6 days course  - may need to be imaging at some point

## 2018-06-06 NOTE — Assessment & Plan Note (Signed)
The joint shows no effusion or structural abnormalities. Unlikely to be gout as no testosterone. Possible to be related to the positive ANA.  - rayos for 6 days course  - may need to be imaging at some point

## 2018-06-06 NOTE — Patient Instructions (Signed)
Nice to meet you  Please take 6 pills then 5, 4 3, 2, 1.  Please try to use ice.  Take tylenol 650 mg three times a day is the best evidence based medicine we have for arthritis.  Glucosamine sulfate 750mg  twice a day is a supplement that has been shown to help moderate to severe arthritis. Vitamin D 2000 IU daily Fish oil 2 grams daily.  Tumeric 500mg  twice daily.  Capsaicin topically up to four times a day may also help with pain. Please send me a MyChart message if you have any questions or updates.

## 2018-06-09 ENCOUNTER — Encounter: Payer: BLUE CROSS/BLUE SHIELD | Admitting: Nurse Practitioner

## 2018-06-12 ENCOUNTER — Telehealth: Payer: Self-pay | Admitting: Family Medicine

## 2018-06-12 ENCOUNTER — Encounter: Payer: Self-pay | Admitting: Family Medicine

## 2018-06-12 ENCOUNTER — Encounter: Payer: Self-pay | Admitting: Nurse Practitioner

## 2018-06-12 ENCOUNTER — Ambulatory Visit (INDEPENDENT_AMBULATORY_CARE_PROVIDER_SITE_OTHER): Payer: BLUE CROSS/BLUE SHIELD | Admitting: Nurse Practitioner

## 2018-06-12 DIAGNOSIS — M791 Myalgia, unspecified site: Secondary | ICD-10-CM | POA: Diagnosis not present

## 2018-06-12 DIAGNOSIS — F332 Major depressive disorder, recurrent severe without psychotic features: Secondary | ICD-10-CM | POA: Diagnosis not present

## 2018-06-12 MED ORDER — PREDNISONE 5 MG PO TABS: ORAL_TABLET | ORAL | 0 refills | Status: DC

## 2018-06-12 MED ORDER — PREGABALIN 25 MG PO CAPS: 25.0000 mg | ORAL_CAPSULE | Freq: Three times a day (TID) | ORAL | 2 refills | Status: DC

## 2018-06-12 MED ORDER — FLUOXETINE HCL 60 MG PO TABS: 30.0000 mg | ORAL_TABLET | Freq: Every day | ORAL | 1 refills | Status: DC

## 2018-06-12 NOTE — Telephone Encounter (Signed)
Pt stated he hasnt [facility] this medication yet but he already completed the sample you gave him.   Pt would like Dr. Jordan Likes send in regular prednisone to [facility] instead. Please call the pt or send my chart message once this is done. I also advise the pt that it is ok to to still take lyrica--per Dr. Jordan Likes.

## 2018-06-12 NOTE — Progress Notes (Signed)
Virtual Visit via Video Note  I connected with Shelia Walker on 06/12/18 at  1:00 PM EDT by a video enabled telemedicine application and verified that I am speaking with the correct person using two identifiers.   I discussed the limitations of evaluation and management by telemedicine and the availability of in person appointments. The patient expressed understanding and agreed to proceed.  CC: f/up of depression and Anxiety.  History of Present Illness: Myalgia: Some improvement with lyrica BID.  Depression: Persistent sadness and lack of interest in taking part in activities outside of home. Depression screen [facility] 2/9 06/12/2018 03/10/2018 11/04/2017  Decreased Interest 1 1 2   Down, Depressed, Hopeless 1 1 2   PHQ - 2 Score 2 2 4   Altered sleeping 1 1 1   Tired, decreased energy 1 1 2   Change in appetite 0 0 1  Feeling bad or failure about yourself  0 1 1  Trouble concentrating 1 1 2   Moving slowly or fidgety/restless 0 0 0  Suicidal thoughts 0 0 1  PHQ-9 Score 5 6 12    GAD 7 : Generalized Anxiety Score 06/12/2018 03/10/2018 11/04/2017  Nervous, Anxious, on Edge 0 1 1  Control/stop worrying 0 0 1  Worry too much - different things 1 0 2  Trouble relaxing 1 0 3  Restless 0 0 1  Easily annoyed or irritable 0 0 1  Afraid - awful might happen 0 0 0  Total GAD 7 Score 2 1 9    Observations/Objective: No BP or pulse reading provided. Physical Exam  Constitutional: [name], place, and time. No distress.  Eyes: Conjunctivae and EOM are normal.  Neck: Normal range of motion. Neck supple.  Pulmonary/Chest: Effort normal.  Neurological: She is alert and oriented to person, place, and time.  Psychiatric: She has a normal mood and affect. Her behavior is normal. Thought content normal.  Vitals reviewed.  Assessment and Plan: Shelia Walker was seen today for follow-up.  Diagnoses and all orders for this visit:  Myalgia -     FLUoxetine 60 MG TABS; Take 30 mg by mouth  daily. Take half tab once a day -     pregabalin (LYRICA) 25 MG capsule; Take 1 capsule (25 mg total) by mouth 3 (three) times daily.  Severe episode of recurrent major depressive disorder, without psychotic features (HCC) -     FLUoxetine 60 MG TABS; Take 30 mg by mouth daily. Take half tab once a day -     pregabalin (LYRICA) 25 MG capsule; Take 1 capsule (25 mg total) by mouth 3 (three) times daily.    Follow Up Instructions: Increase fluoxetine to 30mg  once a day and lyrica to 25mg  TID. F/up in 57month.   I discussed the assessment and treatment plan with the patient. The patient was provided an opportunity to ask questions and all were answered. The patient agreed with the plan and demonstrated an understanding of the instructions.   The patient was advised to call back or seek an in-person evaluation if the symptoms worsen or if the condition fails to improve as anticipated.   [name], NP

## 2018-06-19 ENCOUNTER — Telehealth: Payer: Self-pay | Admitting: Nurse Practitioner

## 2018-06-19 ENCOUNTER — Encounter: Payer: Self-pay | Admitting: Nurse Practitioner

## 2018-06-19 NOTE — Telephone Encounter (Signed)
From: Earlene Plater Sent: 06/19/2018 11:08 AM EDT To: [facility] Subject: Visit Follow-Up Question             Hello,  During our appointment last Monday (06/12/2018), we discussed changing doses of medications and you wrote my new prescriptions. The pharmacy says they haven't gotten the prescriptions, though.   Charlotte please advise, [facility] stated the pt pick up FLuoxetine 20 mg CAP on 04/7/ for 90 days supply and pick up Pregabalin 25 mg on 06/08/2018 for 60 tab. I advised them to fill the new dosage of Fluoxetine since cap cant be cut in half to make it 30 mg and pregabalin can be refill on 04/24--pt is aware.

## 2018-06-19 NOTE — Telephone Encounter (Signed)
No

## 2018-06-19 NOTE — Telephone Encounter (Signed)
You sent in new rx for both med on 04/13 but the pt already pick up the refill that already in place on 06/06/2018 and 06/08/2018 (she pick up the old dosage before she saw you for the appt).   I told the pharmacy to go ahead and fill the new rx we sent and the pt aware of request refill for lyrical on 06/23/2018. Do you want me to do anything different?

## 2018-06-19 NOTE — Telephone Encounter (Signed)
I do not understand. I sent 60mg  tabs to be cut in half. Why did she get 20mg  capsules? I sent new lyrica Rx, 90tabs.  What is the confusion?

## 2018-06-20 ENCOUNTER — Telehealth: Payer: Self-pay | Admitting: Nurse Practitioner

## 2018-06-20 NOTE — Telephone Encounter (Signed)
PA for Fluoxetine 60 mg tab--taking 1/2 tab daily (total of 30 mg) started, waiting for result.   Shelia Walker (Key: [id number])

## 2018-06-20 NOTE — Telephone Encounter (Signed)
Copied from CRM [id number]. Topic: General - Other >> Jun 20, 2018 11:27 AM Tamela Oddi wrote: Reason for CRM: Nicki with [employer] called to inform the doctor that the PA for FLUoxetine 60 MG TABS has been approved.  If there are any questions, please call us at [phone]

## 2018-06-20 NOTE — Telephone Encounter (Signed)
PA approve, pharmacy notified and fax over the approve letter from [facility] to [facility].   Copied from CRM [id number]. Topic: General - Other >> Jun 20, 2018 11:27 AM Tamela Oddi wrote: Reason for CRM: Nicki with [employer] called to inform the doctor that the PA for FLUoxetine 60 MG TABS has been approved.  If there are any questions, please call us at [phone]

## 2018-06-20 NOTE — Telephone Encounter (Signed)
Pt aware.

## 2018-07-05 DIAGNOSIS — F640 Transsexualism: Secondary | ICD-10-CM | POA: Diagnosis not present

## 2018-07-11 ENCOUNTER — Encounter (HOSPITAL_COMMUNITY): Payer: Self-pay

## 2018-07-11 ENCOUNTER — Ambulatory Visit (INDEPENDENT_AMBULATORY_CARE_PROVIDER_SITE_OTHER): Payer: BLUE CROSS/BLUE SHIELD | Admitting: Nurse Practitioner

## 2018-07-11 ENCOUNTER — Emergency Department (HOSPITAL_COMMUNITY)
Admission: EM | Admit: 2018-07-11 | Discharge: 2018-07-11 | Disposition: A | Discharge status: Home / Self Care | Payer: BLUE CROSS/BLUE SHIELD | Attending: Emergency Medicine | Admitting: Emergency Medicine

## 2018-07-11 ENCOUNTER — Encounter: Payer: Self-pay | Admitting: Nurse Practitioner

## 2018-07-11 ENCOUNTER — Ambulatory Visit (INDEPENDENT_AMBULATORY_CARE_PROVIDER_SITE_OTHER)
Admission: EM | Admit: 2018-07-11 | Discharge: 2018-07-11 | Disposition: A | Discharge status: Home / Self Care | Payer: BLUE CROSS/BLUE SHIELD | Source: Home / Self Care | Attending: Family Medicine | Admitting: Family Medicine

## 2018-07-11 ENCOUNTER — Other Ambulatory Visit: Payer: Self-pay

## 2018-07-11 ENCOUNTER — Emergency Department (HOSPITAL_COMMUNITY): Payer: BLUE CROSS/BLUE SHIELD

## 2018-07-11 VITALS — Temp 98.8°F | Ht 63.0 in | Wt 165.8 lb

## 2018-07-11 DIAGNOSIS — R319 Hematuria, unspecified: Secondary | ICD-10-CM | POA: Diagnosis not present

## 2018-07-11 DIAGNOSIS — R109 Unspecified abdominal pain: Secondary | ICD-10-CM | POA: Diagnosis not present

## 2018-07-11 DIAGNOSIS — R300 Dysuria: Secondary | ICD-10-CM | POA: Diagnosis not present

## 2018-07-11 DIAGNOSIS — N200 Calculus of kidney: Secondary | ICD-10-CM | POA: Diagnosis not present

## 2018-07-11 DIAGNOSIS — F332 Major depressive disorder, recurrent severe without psychotic features: Secondary | ICD-10-CM

## 2018-07-11 DIAGNOSIS — N202 Calculus of kidney with calculus of ureter: Secondary | ICD-10-CM | POA: Diagnosis not present

## 2018-07-11 DIAGNOSIS — Z87891 Personal history of nicotine dependence: Secondary | ICD-10-CM | POA: Diagnosis not present

## 2018-07-11 DIAGNOSIS — M791 Myalgia, unspecified site: Secondary | ICD-10-CM | POA: Diagnosis not present

## 2018-07-11 DIAGNOSIS — Z79899 Other long term (current) drug therapy: Secondary | ICD-10-CM | POA: Insufficient documentation

## 2018-07-11 DIAGNOSIS — F1290 Cannabis use, unspecified, uncomplicated: Secondary | ICD-10-CM | POA: Insufficient documentation

## 2018-07-11 DIAGNOSIS — N1339 Other hydronephrosis: Secondary | ICD-10-CM | POA: Diagnosis not present

## 2018-07-11 DIAGNOSIS — N132 Hydronephrosis with renal and ureteral calculous obstruction: Secondary | ICD-10-CM | POA: Diagnosis not present

## 2018-07-11 DIAGNOSIS — N1330 Unspecified hydronephrosis: Secondary | ICD-10-CM | POA: Insufficient documentation

## 2018-07-11 DIAGNOSIS — R1032 Left lower quadrant pain: Secondary | ICD-10-CM | POA: Diagnosis present

## 2018-07-11 LAB — BASIC METABOLIC PANEL
Anion gap: 10 (ref 5–15)
BUN: 13 mg/dL (ref 6–20)
CO2: 25 mmol/L (ref 22–32)
Calcium: 9.6 mg/dL (ref 8.9–10.3)
Chloride: 101 mmol/L (ref 98–111)
Creatinine, Ser: 0.93 mg/dL (ref 0.44–1.00)
GFR calc Af Amer: >60 mL/min (ref >60)
GFR calc non Af Amer: >60 mL/min (ref >60)
Glucose, Bld: 89 mg/dL (ref 70–99)
Potassium: 3.4 mmol/L — ABNORMAL LOW (ref 3.5–5.1)
Sodium: 136 mmol/L (ref 135–145)

## 2018-07-11 LAB — POCT URINALYSIS DIP (DEVICE)
Bilirubin Urine: NEGATIVE
Glucose, UA: NEGATIVE mg/dL
Ketones, ur: NEGATIVE mg/dL
Leukocytes,Ua: NEGATIVE
Nitrite: NEGATIVE
Protein, ur: 30 mg/dL — AB
Specific Gravity, Urine: >=1.03 (ref 1.005–1.030)
Urobilinogen, UA: 0.2 mg/dL (ref 0.0–1.0)
pH: 5.5 (ref 5.0–8.0)

## 2018-07-11 LAB — CBC WITH DIFFERENTIAL/PLATELET
Abs Immature Granulocytes: 0.06 10*3/uL (ref 0.00–0.07)
Basophils Absolute: 0 10*3/uL (ref 0.0–0.1)
Basophils Relative: 0 %
Eosinophils Absolute: 0.1 10*3/uL (ref 0.0–0.5)
Eosinophils Relative: 1 %
HCT: 43.2 % (ref 36.0–46.0)
Hemoglobin: 14.3 g/dL (ref 12.0–15.0)
Immature Granulocytes: 0 %
Lymphocytes Relative: 19 %
Lymphs Abs: 2.5 10*3/uL (ref 0.7–4.0)
MCH: 28.4 pg (ref 26.0–34.0)
MCHC: 33.1 g/dL (ref 30.0–36.0)
MCV: 85.7 fL (ref 80.0–100.0)
Monocytes Absolute: 0.9 10*3/uL (ref 0.1–1.0)
Monocytes Relative: 7 %
Neutro Abs: 9.8 10*3/uL — ABNORMAL HIGH (ref 1.7–7.7)
Neutrophils Relative %: 73 %
Platelets: 396 10*3/uL (ref 150–400)
RBC: 5.04 MIL/uL (ref 3.87–5.11)
RDW: 12.5 % (ref 11.5–15.5)
WBC: 13.4 10*3/uL — ABNORMAL HIGH (ref 4.0–10.5)
nRBC: 0 % (ref 0.0–0.2)

## 2018-07-11 MED ORDER — SODIUM CHLORIDE 0.9 % IV BOLUS
1000.0000 mL | Freq: Once | INTRAVENOUS | Status: AC
  Administered 2018-07-11: 1000 mL via INTRAVENOUS

## 2018-07-11 MED ORDER — MORPHINE SULFATE (PF) 4 MG/ML IV SOLN
4.0000 mg | Freq: Once | INTRAVENOUS | Status: AC
  Administered 2018-07-11: 22:00:00 4 mg via INTRAVENOUS
  Filled 2018-07-11: qty 1

## 2018-07-11 MED ORDER — HYDROCODONE-ACETAMINOPHEN 5-325 MG PO TABS
ORAL_TABLET | ORAL | Status: AC
  Filled 2018-07-11: qty 1

## 2018-07-11 MED ORDER — FLUOXETINE HCL 60 MG PO TABS: 30.0000 mg | ORAL_TABLET | Freq: Every day | ORAL | 1 refills | Status: AC

## 2018-07-11 MED ORDER — HYDROCODONE-ACETAMINOPHEN 5-325 MG PO TABS: 1.0000 | ORAL_TABLET | Freq: Once | ORAL | Status: DC

## 2018-07-11 MED ORDER — KETOROLAC TROMETHAMINE 15 MG/ML IJ SOLN
15.0000 mg | Freq: Once | INTRAMUSCULAR | Status: AC
  Administered 2018-07-11: 15 mg via INTRAVENOUS
  Filled 2018-07-11: qty 1

## 2018-07-11 MED ORDER — PREGABALIN 50 MG PO CAPS: 50.0000 mg | ORAL_CAPSULE | Freq: Three times a day (TID) | ORAL | 2 refills | Status: DC

## 2018-07-11 MED ORDER — HYDROCODONE-ACETAMINOPHEN 5-325 MG PO TABS: 1.0000 | ORAL_TABLET | ORAL | 0 refills | Status: DC | PRN

## 2018-07-11 NOTE — Discharge Instructions (Addendum)
You need to go to the ER for a kidney stone evaluation

## 2018-07-11 NOTE — ED Notes (Signed)
Pt transported from CT to room WPT.

## 2018-07-11 NOTE — Discharge Instructions (Signed)
Ibuprofen every 6 hours

## 2018-07-11 NOTE — ED Triage Notes (Signed)
Patient presents to Urgent Care with complaints of left sided severe flank pain and one episode of bloody urine since approx 30 mins ago. Patient reports history of urinary tract infections, no pain meds pta.

## 2018-07-11 NOTE — Progress Notes (Signed)
Virtual Visit via Video Note  I connected with Shelia Walker on 07/11/18 at  1:00 PM EDT by a video enabled telemedicine application and verified that I am speaking with the correct person using two identifiers.  Location: Patient: home Provider: office   I discussed the limitations of evaluation and management by telemedicine and the availability of in person appointments. The patient expressed understanding and agreed to proceed.  CC: 1 mo follow up on anxiety and depression/ pregabalin is not working as much now.   pt is complaining of pain on hip toward back area, and knee joints pain.  History of Present Illness: Generalized joint and muscle pain: Worse in evening, worse with activity, AM stiffness lasting >1hr. No improvement with lyrica 25mg  TID Last testosterone injection 27months ago per patient even though he picked up a prescription 04/2018. appt with myopathy clinic on hold due to COVID pandemic. Last appt with rheumatology 12/2017.  Depression: Stable mood with fluoxetine   Observations/Objective: Physical Exam  Constitutional: [name], place, and time.  Pulmonary/Chest: Effort normal.  Neurological: She is alert and oriented to person, place, and time.  Psychiatric: [name]. Thought content normal. [name]. Cognition and memory are normal.  Vitals reviewed.  Assessment and Plan: .[name] was seen today for follow-up and hip pain.  Diagnoses and all orders for this visit:  Severe episode of recurrent major depressive disorder, without psychotic features (HCC) -     pregabalin (LYRICA) 50 MG capsule; Take 1 capsule (50 mg total) by mouth 3 (three) times daily. -     FLUoxetine HCl 60 MG TABS; Take 30 mg by mouth daily. Take half tab once a day  Myalgia -     pregabalin (LYRICA) 50 MG capsule; Take 1 capsule (50 mg total) by mouth 3 (three) times daily. -     FLUoxetine HCl 60 MG TABS; Take 30  mg by mouth daily. Take half tab once a day   Follow Up Instructions: Advised to continue walking (in morning when pain is less) and stretching exercise to maintain mobility and muscle strength. Use of tylenol or NSAID's as needed for pain Make f/up appt with rheumatology. Increase lyrica to 50mg  TID. F/up in 60month.  I discussed the assessment and treatment plan with the patient. The patient was provided an opportunity to ask questions and all were answered. The patient agreed with the plan and demonstrated an understanding of the instructions.   The patient was advised to call back or seek an in-person evaluation if the symptoms worsen or if the condition fails to improve as anticipated.  [name], NP

## 2018-07-11 NOTE — ED Triage Notes (Signed)
Pt reports L sided flank pain that started about an hour ago with blood in urine and dysuria. Went to UC and sent for evaluation of kidney stones.

## 2018-07-11 NOTE — Patient Instructions (Addendum)
Advised to continue walking (in morning when pain is less) and stretching exercise to maintain mobility and muscle strength. Use of tylenol or NSAID's as needed for pain Make f/up appt with rheumatology. Increase lyrica to 50mg  TID. F/up in 49month.

## 2018-07-11 NOTE — ED Provider Notes (Signed)
[facility]    CSN: [id number] Arrival date & time: 07/11/18  1829     History   Chief Complaint Chief Complaint  Patient presents with  . Flank Pain    HPI Shelia Walker is a 23 y.o. adult.   HPI  Patient is never had kidney stones.  Never had kidney infections.  Kidney stones do run in the family.  Today developed severe sudden left flank pain.  Radiates to the left groin.  No nausea or vomiting.  No fever.  Noticed red specks of particulate matter in the urine.  Thought it was hematuria.  He is here to be evaluated for kidney stone. Longstanding back pain myalgias body pain.  This is distinctly different.  Past Medical History:  Diagnosis Date  . Anxiety   . Depression   . Migraines   . PTSD (post-traumatic stress disorder)   . Transgender     Patient Active Problem List   Diagnosis Date Noted  . Acute pain of right knee 06/06/2018  . Vitamin D deficiency 05/04/2018  . Positive ANA (antinuclear antibody) 11/07/2017  . 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 Severe episode of recurrent major depressive disorder, without psychotic features (HCC) 11/04/2017  . Myalgia 11/04/2017    Past Surgical History:  Procedure Laterality Date  . WISDOM TOOTH EXTRACTION      OB History   No obstetric history on file.      Home Medications    Prior to Admission medications   Medication Sig Start Date End Date Taking? Authorizing Provider  B-D 3CC LUER-LOK SYR 25GX1/2" 25G X 1-1/2" 3 ML MISC INJECT 1 ML Q OTHER WEEK INTO MUSCLE UTD 10/21/17   [provider]  BD DISP NEEDLES 22G X 1-1/2" MISC USE UTD 10/21/17   [provider]  Fish Oil-Cholecalciferol (FISH OIL + D3 PO) Take 100 mg by mouth 2 (two) times a day.    [provider]  FLUoxetine HCl 60 MG TABS Take 30 mg by mouth daily. Take half tab once a day 07/11/18   Nche, Bonna Gains, NP  pregabalin (LYRICA) 50 MG capsule Take 1 capsule (50 mg total) by mouth 3 (three)  times daily. 07/11/18   Nche, Bonna Gains, NP  testosterone cypionate (DEPOTESTOSTERONE CYPIONATE) 200 MG/ML injection Inject 200 mg into the muscle every 14 (fourteen) days. Has not injected in 3 weeks 10/21/17   [provider]  Turmeric 500 MG CAPS Take 500 mg by mouth daily.    [provider]    Family History Family History  Problem Relation Age of Onset  . Fibromyalgia Mother   . Cerebral aneurysm Mother   . Hyperlipidemia Mother   . Hypertension Mother   . Stroke Mother        x3, first stroke age 49, third stroke 2013  . Depression Mother   . Autoimmune disease Mother   . Depression Father   . Fibromyalgia Maternal Grandmother   . Arthritis Maternal Grandmother   . Diabetes Maternal Grandmother   . Depression Maternal Grandmother   . Depression Sister   . Learning disabilities Sister   . Mental illness Sister   . Healthy Brother     Social History Social History   Tobacco Use  . Smoking status: Former Smoker    Packs/day: 0.10    Years: 5.00    Pack years: 0.50    Types: Cigarettes    Last attempt to quit: 05/2017    Years since quitting: 1.1  .  Smokeless tobacco: Never Used  . Tobacco comment: 3 cigs daily  Substance Use Topics  . Alcohol use: Yes    Comment: social  . Drug use: Yes    Types: Marijuana    Comment: daily     Allergies   Ginger and Pyridium [phenazopyridine hcl]   Review of Systems Review of Systems  Constitutional: Negative for chills and fever.  HENT: Negative for ear pain and sore throat.   Eyes: Negative for pain and visual disturbance.  Respiratory: Negative for cough and shortness of breath.   Cardiovascular: Negative for chest pain and palpitations.  Gastrointestinal: Negative for abdominal pain and vomiting.  Genitourinary: Positive for flank pain and hematuria. Negative for dysuria.  Musculoskeletal: Negative for arthralgias and back pain.  Skin: Negative for color change and rash.  Neurological:  Negative for seizures and syncope.  All other systems reviewed and are negative.    Physical Exam Triage Vital Signs ED Triage Vitals  Enc Vitals Group     BP 07/11/18 1850 132/86     Pulse Rate 07/11/18 1850 (!) 103     Resp 07/11/18 1850 18     Temp 07/11/18 1850 (!) 97.5 F (36.4 C)     Temp Source 07/11/18 1850 Oral     SpO2 07/11/18 1850 100 %     Weight --      Height --      Head Circumference --      Peak Flow --      Pain Score 07/11/18 1846 10     Pain Loc --      Pain Edu? --      Excl. in GC? --    No data found.  Updated Vital Signs BP 132/86 (BP Location: Left Arm)   Pulse (!) 103   Temp (!) 97.5 F (36.4 C) (Oral)   Resp 18   SpO2 100%   Visual Acuity Right Eye Distance:   Left Eye Distance:   Bilateral Distance:    Right Eye Near:   Left Eye Near:    Bilateral Near:     Physical Exam Constitutional:      General: He is not in acute distress.    Appearance: He is well-developed. He is obese.  HENT:     Head: Normocephalic and atraumatic.  Eyes:     Conjunctiva/sclera: Conjunctivae normal.     Pupils: Pupils are equal, round, and reactive to light.  Neck:     Musculoskeletal: Normal range of motion.  Cardiovascular:     Rate and Rhythm: Normal rate.  Pulmonary:     Effort: Pulmonary effort is normal. No respiratory distress.  Abdominal:     General: Abdomen is flat. There is no distension.     Palpations: Abdomen is soft.     Tenderness: There is abdominal tenderness. There is left CVA tenderness.     Comments: Tenderness to deep palpation of the right mid abdomen.  CVA tenderness to tapping.  Gross hematuria on exam  Musculoskeletal: Normal range of motion.     Comments: No tenderness to palpation of lumbar muscles.  Skin:    General: Skin is warm and dry.  Neurological:     Mental Status: He is alert.      UC Treatments / Results  Labs (all labs ordered are listed, but only abnormal results are displayed) Labs Reviewed   POCT URINALYSIS DIP (DEVICE) - Abnormal; Notable for the following components:      Result Value  Hgb urine dipstick LARGE (*)    Protein, ur 30 (*)    All other components within normal limits    EKG None  Radiology No results found.  Procedures Procedures (including critical care time)  Medications Ordered in UC Medications - No data to display  Initial Impression / Assessment and Plan / UC Course  I have reviewed the triage vital signs and the nursing notes.  Pertinent labs & imaging results that were available during my care of the patient were reviewed by me and considered in my medical decision making (see chart for details).     Explained to the patient that we are unable to diagnose and treat kidney stones at the urgent care center.  He needs a CAT scan.  He is transferred down to the emergency room.  He request pain medication.  Is given 1 Vicodin.  Confirm there is no allergies. Final Clinical Impressions(s) / UC Diagnoses   Final diagnoses:  Left flank pain  Hematuria, unspecified type     Discharge Instructions     You need to go to the ER for a kidney stone evaluation   ED Prescriptions    None     Controlled Substance Prescriptions [place] Controlled Substance Registry consulted? Yes, I have consulted the  Controlled Substances Registry for this patient, and feel the risk/benefit ratio today is favorable for proceeding with this prescription for a controlled substance.   Eustace MooreNelson, Yvonne Sue, MD 07/11/18 [phone]

## 2018-07-11 NOTE — ED Notes (Signed)
Patient transported to CT 

## 2018-07-11 NOTE — ED Provider Notes (Signed)
[facility] EMERGENCY DEPARTMENT Provider Note   CSN: [id number] Arrival date & time: 07/11/18  1931    History   Chief Complaint Chief Complaint  Patient presents with  . Flank Pain    HPI Shawnette Augello is a 23 y.o. adult.  With who presents to the ED with pain with urination, hematuria and left high flank pain that began today.  States he has a family history of kidney stones but is never had one before.  Denies any fevers or other symptoms.  Was seen in urgent care and sent here for evaluation for kidney stone.    HPI  Past Medical History:  Diagnosis Date  . Anxiety   . Depression   . Migraines   . PTSD (post-traumatic stress disorder)   . Transgender     Patient Active Problem List   Diagnosis Date Noted  . Acute pain of right knee 06/06/2018  . Vitamin D deficiency 05/04/2018  . Positive ANA (antinuclear antibody) 11/07/2017  . 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 Severe episode of recurrent major depressive disorder, without psychotic features (HCC) 11/04/2017  . Myalgia 11/04/2017    Past Surgical History:  Procedure Laterality Date  . WISDOM TOOTH EXTRACTION       OB History   No obstetric history on file.      Home Medications    Prior to Admission medications   Medication Sig Start Date End Date Taking? Authorizing Provider  Fish Oil-Cholecalciferol (FISH OIL + D3 PO) Take 100 mg by mouth 2 (two) times a day.   Yes [provider]  FLUoxetine HCl 60 MG TABS Take 30 mg by mouth daily. Take half tab once a day 07/11/18  Yes Nche, Bonna Gains, NP  pregabalin (LYRICA) 50 MG capsule Take 1 capsule (50 mg total) by mouth 3 (three) times daily. 07/11/18  Yes Nche, Bonna Gains, NP  Turmeric 500 MG CAPS Take 500 mg by mouth daily.   Yes [provider]  B-D 3CC LUER-LOK SYR 25GX1/2" 25G X 1-1/2" 3 ML MISC INJECT 1 ML Q OTHER WEEK INTO MUSCLE UTD 10/21/17   [provider]  BD DISP NEEDLES 22G X  1-1/2" MISC USE UTD 10/21/17   [provider]  HYDROcodone-acetaminophen (NORCO/VICODIN) 5-325 MG tablet Take 1 tablet by mouth every 4 (four) hours as needed for severe pain. 07/11/18   Dicky Doe, MD    Family History Family History  Problem Relation Age of Onset  . Fibromyalgia Mother   . Cerebral aneurysm Mother   . Hyperlipidemia Mother   . Hypertension Mother   . Stroke Mother        x3, first stroke age 31, third stroke 2013  . Depression Mother   . Autoimmune disease Mother   . Depression Father   . Fibromyalgia Maternal Grandmother   . Arthritis Maternal Grandmother   . Diabetes Maternal Grandmother   . Depression Maternal Grandmother   . Depression Sister   . Learning disabilities Sister   . Mental illness Sister   . Healthy Brother     Social History Social History   Tobacco Use  . Smoking status: Former Smoker    Packs/day: 0.10    Years: 5.00    Pack years: 0.50    Types: Cigarettes    Last attempt to quit: 05/2017    Years since quitting: 1.1  . Smokeless tobacco: Never Used  . Tobacco comment: 3 cigs daily  Substance Use Topics  . Alcohol  use: Yes    Comment: social  . Drug use: Yes    Types: Marijuana    Comment: daily     Allergies   Ginger and Pyridium [phenazopyridine hcl]   Review of Systems Review of Systems  Constitutional: Negative for activity change, appetite change, chills, fatigue and fever.  HENT: Negative for ear pain and sore throat.   Eyes: Negative for pain and visual disturbance.  Respiratory: Negative for cough and shortness of breath.   Cardiovascular: Negative for chest pain and palpitations.  Gastrointestinal: Positive for abdominal pain. Negative for diarrhea, nausea and vomiting.  Genitourinary: Positive for dysuria, flank pain and hematuria.  Musculoskeletal: Negative for arthralgias and back pain.  Skin: Negative for color change and rash.  Neurological: Negative for seizures, syncope and headaches.   All other systems reviewed and are negative.    Physical Exam Updated Vital Signs BP 119/77   Pulse 94   Temp (!) 97.5 F (36.4 C) (Oral)   Resp 13   SpO2 99%   Physical Exam Vitals signs and nursing note reviewed.  Constitutional:      Appearance: Normal appearance. He is well-developed.  HENT:     Head: Normocephalic and atraumatic.  Eyes:     Conjunctiva/sclera: Conjunctivae normal.  Neck:     Musculoskeletal: Neck supple.  Cardiovascular:     Rate and Rhythm: Regular rhythm. Tachycardia present.     Heart sounds: No murmur.  Pulmonary:     Effort: Pulmonary effort is normal. No respiratory distress.     Breath sounds: Normal breath sounds. No wheezing.  Abdominal:     Palpations: Abdomen is soft.     Tenderness: There is abdominal tenderness. There is left CVA tenderness.  Musculoskeletal:        General: No swelling or tenderness.  Skin:    General: Skin is warm and dry.  Neurological:     Mental Status: He is alert.      ED Treatments / Results  Labs (all labs ordered are listed, but only abnormal results are displayed) Labs Reviewed  CBC WITH DIFFERENTIAL/PLATELET - Abnormal; Notable for the following components:      Result Value   WBC 13.4 (*)    Neutro Abs 9.8 (*)    All other components within normal limits  BASIC METABOLIC PANEL - Abnormal; Notable for the following components:   Potassium 3.4 (*)    All other components within normal limits    EKG None  Radiology Ct Renal Stone Study  Result Date: 07/11/2018 CLINICAL DATA:  23 year old female with flank pain. EXAM: CT ABDOMEN AND PELVIS WITHOUT CONTRAST TECHNIQUE: Multidetector CT imaging of the abdomen and pelvis was performed following the standard protocol without IV contrast. COMPARISON:  None. FINDINGS: Evaluation of this exam is limited in the absence of intravenous contrast. Lower chest: The visualized lung bases are clear. No intra-abdominal free air or free fluid. Hepatobiliary: The  liver is unremarkable. No intrahepatic biliary ductal dilatation. The gallbladder is contracted. Pancreas: Unremarkable. No pancreatic ductal dilatation or surrounding inflammatory changes. Spleen: Normal in size without focal abnormality. Adrenals/Urinary Tract: The adrenal glands are unremarkable. There is a 2 mm stone in the distal left ureter adjacent to the left ureterovesical junction with mild left hydronephrosis. There is a punctate nonobstructing left renal interpolar stone. There are several nonobstructing right renal calculi measure up to 4 mm in the inferior pole of the right kidney. There is no hydronephrosis on the right. The right ureter and urinary  bladder appear unremarkable. Stomach/Bowel: There is no bowel obstruction or active inflammation. Normal appendix. Vascular/Lymphatic: The abdominal aorta and IVC are unremarkable on this noncontrast CT. No portal venous gas. There is no adenopathy. Reproductive: The uterus is retroverted. An intrauterine device is noted. The ovaries are grossly unremarkable. No pelvic mass. Other: Small fat containing umbilical hernia. Musculoskeletal: No acute or significant osseous findings. IMPRESSION: 1. A 2 mm distal left ureteral stone with mild left hydronephrosis. 2. Additional small nonobstructing bilateral renal calculi. No hydronephrosis on the right. Electronically Signed   By: Elgie CollardArash  Radparvar M.D.   On: 07/11/2018 20:45    Procedures Procedures (including critical care time)  Medications Ordered in ED Medications  ketorolac (TORADOL) 15 MG/ML injection 15 mg (15 mg Intravenous Given 07/11/18 2203)  morphine 4 MG/ML injection 4 mg (4 mg Intravenous Given 07/11/18 2204)  sodium chloride 0.9 % bolus 1,000 mL (0 mLs Intravenous Stopped 07/11/18 2256)     Initial Impression / Assessment and Plan / ED Course  I have reviewed the triage vital signs and the nursing notes.  Pertinent labs & imaging results that were available during my care of the  patient were reviewed by me and considered in my medical decision making (see chart for details).        Patient is a 23yo transgender (F to M) pt that presents with left flank pain and hematuria that started today.  On initial exam he appears uncomfortable and is tachycardic.   CT scan shows a 2mm non-obstructing distal left ureteral stone with mild left hydronephrosis.  No AKI on labs and UA not consistent with infection.  Pain relieved in the ED after toradol and morphine and 1L IVF. Resolution of tachycardia.  Patient discharged home with strict return precautions including signs on an infection or uncontrollable pain. Will follow up with PCP.  Final Clinical Impressions(s) / ED Diagnoses   Final diagnoses:  Nephrolithiasis  Other hydronephrosis    ED Discharge Orders         Ordered    HYDROcodone-acetaminophen (NORCO/VICODIN) 5-325 MG tablet  Every 4 hours PRN     07/11/18 2305           Dicky DoeFord, Abron Neddo, MD 07/12/18 1313    Raeford RazorKohut, Stephen, MD 07/12/18 [phone]

## 2018-07-12 ENCOUNTER — Telehealth: Payer: Self-pay | Admitting: *Deleted

## 2018-07-12 NOTE — Telephone Encounter (Signed)
[facility] ED TOC CM -referral medication problems  Call made to pt and left message for return call. Patient had questions about her dc meds.Isidoro Donning RN CCM Case Mgmt phone [phone]

## 2018-07-19 DIAGNOSIS — F640 Transsexualism: Secondary | ICD-10-CM | POA: Diagnosis not present

## 2018-07-24 ENCOUNTER — Emergency Department (HOSPITAL_COMMUNITY)
Admission: EM | Admit: 2018-07-24 | Discharge: 2018-07-24 | Disposition: A | Discharge status: Home / Self Care | Payer: BLUE CROSS/BLUE SHIELD | Attending: Emergency Medicine | Admitting: Emergency Medicine

## 2018-07-24 ENCOUNTER — Other Ambulatory Visit: Payer: Self-pay

## 2018-07-24 ENCOUNTER — Emergency Department (HOSPITAL_COMMUNITY): Payer: BLUE CROSS/BLUE SHIELD

## 2018-07-24 ENCOUNTER — Encounter (HOSPITAL_COMMUNITY): Payer: Self-pay | Admitting: Emergency Medicine

## 2018-07-24 DIAGNOSIS — Z87891 Personal history of nicotine dependence: Secondary | ICD-10-CM | POA: Diagnosis not present

## 2018-07-24 DIAGNOSIS — N12 Tubulo-interstitial nephritis, not specified as acute or chronic: Secondary | ICD-10-CM | POA: Diagnosis not present

## 2018-07-24 DIAGNOSIS — R109 Unspecified abdominal pain: Secondary | ICD-10-CM

## 2018-07-24 DIAGNOSIS — Z79899 Other long term (current) drug therapy: Secondary | ICD-10-CM | POA: Diagnosis not present

## 2018-07-24 DIAGNOSIS — N3000 Acute cystitis without hematuria: Secondary | ICD-10-CM | POA: Insufficient documentation

## 2018-07-24 DIAGNOSIS — R1031 Right lower quadrant pain: Secondary | ICD-10-CM | POA: Diagnosis present

## 2018-07-24 HISTORY — DX: Unspecified convulsions: R56.9

## 2018-07-24 LAB — COMPREHENSIVE METABOLIC PANEL
ALT: 36 U/L (ref 0–44)
AST: 18 U/L (ref 15–41)
Albumin: 4.5 g/dL (ref 3.5–5.0)
Alkaline Phosphatase: 71 U/L (ref 38–126)
Anion gap: 8 (ref 5–15)
BUN: 11 mg/dL (ref 6–20)
CO2: 25 mmol/L (ref 22–32)
Calcium: 8.8 mg/dL — ABNORMAL LOW (ref 8.9–10.3)
Chloride: 107 mmol/L (ref 98–111)
Creatinine, Ser: 0.69 mg/dL (ref 0.44–1.00)
GFR calc Af Amer: >60 mL/min (ref >60)
GFR calc non Af Amer: >60 mL/min (ref >60)
Glucose, Bld: 98 mg/dL (ref 70–99)
Potassium: 4.1 mmol/L (ref 3.5–5.1)
Sodium: 140 mmol/L (ref 135–145)
Total Bilirubin: 0.4 mg/dL (ref 0.3–1.2)
Total Protein: 8.4 g/dL — ABNORMAL HIGH (ref 6.5–8.1)

## 2018-07-24 LAB — CBC
HCT: 44.4 % (ref 36.0–46.0)
Hemoglobin: 14.4 g/dL (ref 12.0–15.0)
MCH: 28.5 pg (ref 26.0–34.0)
MCHC: 32.4 g/dL (ref 30.0–36.0)
MCV: 87.9 fL (ref 80.0–100.0)
Platelets: 363 10*3/uL (ref 150–400)
RBC: 5.05 MIL/uL (ref 3.87–5.11)
RDW: 13 % (ref 11.5–15.5)
WBC: 6.7 10*3/uL (ref 4.0–10.5)
nRBC: 0 % (ref 0.0–0.2)

## 2018-07-24 LAB — URINALYSIS, ROUTINE W REFLEX MICROSCOPIC
Bilirubin Urine: NEGATIVE
Glucose, UA: NEGATIVE mg/dL
Ketones, ur: NEGATIVE mg/dL
Nitrite: NEGATIVE
Protein, ur: NEGATIVE mg/dL
Specific Gravity, Urine: 1.016 (ref 1.005–1.030)
pH: 6 (ref 5.0–8.0)

## 2018-07-24 LAB — PREGNANCY, URINE: Preg Test, Ur: NEGATIVE

## 2018-07-24 LAB — LIPASE, BLOOD: Lipase: 35 U/L (ref 11–51)

## 2018-07-24 MED ORDER — ONDANSETRON HCL 4 MG/2ML IJ SOLN
4.0000 mg | Freq: Once | INTRAMUSCULAR | Status: AC
  Administered 2018-07-24: 4 mg via INTRAVENOUS
  Filled 2018-07-24: qty 2

## 2018-07-24 MED ORDER — SULFAMETHOXAZOLE-TRIMETHOPRIM 800-160 MG PO TABS: 1.0000 | ORAL_TABLET | Freq: Two times a day (BID) | ORAL | 0 refills | Status: AC

## 2018-07-24 MED ORDER — SODIUM CHLORIDE 0.9 % IV BOLUS
1000.0000 mL | Freq: Once | INTRAVENOUS | Status: AC
  Administered 2018-07-24: 1000 mL via INTRAVENOUS

## 2018-07-24 MED ORDER — KETOROLAC TROMETHAMINE 30 MG/ML IJ SOLN
30.0000 mg | Freq: Once | INTRAMUSCULAR | Status: AC
  Administered 2018-07-24: 30 mg via INTRAVENOUS
  Filled 2018-07-24: qty 1

## 2018-07-24 MED ORDER — HYDROCODONE-ACETAMINOPHEN 5-325 MG PO TABS
1.0000 | ORAL_TABLET | Freq: Once | ORAL | Status: AC
  Administered 2018-07-24: 1 via ORAL
  Filled 2018-07-24: qty 1

## 2018-07-24 NOTE — ED Notes (Signed)
Patient made aware of urinalysis. Patient stated they are unable to provide at this time.

## 2018-07-24 NOTE — ED Triage Notes (Signed)
Patient arrived by self from home. Pt c/o RT sided flank pain. Pt stated they are experiencing sharp,dull pain 9/10.   Patient was seen about 2 weeks ago at [facility] for RT sided flank pain. Pt was discharged w/ medications.   Pt is unsure whether stone was passed. Pt said pain went away and then pain came back this morning.

## 2018-07-24 NOTE — Discharge Instructions (Signed)
You were seen in the ED today for right flank pain; your labs were reassuring but your urine did show a UTI; given this with the flank pain I think you have a kidney infection called pyelonephritis. Please take the antibiotics as prescribed and finish the entire course. Please follow up with your PCP regarding your ED visit today. Return to the ED for any worsening symptoms including unchange in symptoms after 48 hours on antibiotics, fever, vomiting.

## 2018-07-24 NOTE — ED Provider Notes (Signed)
[facility]-EMERGENCY DEPT Provider Note   CSN: [id number] Arrival date & time: 07/24/18  6378    History   Chief Complaint Chief Complaint  Patient presents with  . Flank Pain    HPI Shelia Walker is a 23 y.o. adult who is transgender (F to [place]) who presents to the ED today complaining of sudden onset, constant, stabbing, right flank pain that began this AM. Pt also endorses nausea; no vomiting. He was seen on 05/13 for left sided flank pain and diagnosed with a 2 mm distal left ureteral stone with mild left hydronephrosis. Pt was discharged home at that point with pain medication and PCP follow up. He reports the pain subsided a couple of days later and then today he began having right sided pain. He has not taken anything for the pain yet. Denies fever, chills, abdominal pain, vomiting, diarrhea, constipation, frequency, hematuria, or any other associated symptoms.        Past Medical History:  Diagnosis Date  . Anxiety   . Depression   . Migraines   . PTSD (post-traumatic stress disorder)   . Seizures (HCC)   . Transgender     Patient Active Problem List   Diagnosis Date Noted  . Acute pain of right knee 06/06/2018  . Vitamin D deficiency 05/04/2018  . Positive ANA (antinuclear antibody) 11/07/2017  . 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 Severe episode of recurrent major depressive disorder, without psychotic features (HCC) 11/04/2017  . Myalgia 11/04/2017    Past Surgical History:  Procedure Laterality Date  . WISDOM TOOTH EXTRACTION       OB History   No obstetric history on file.      Home Medications    Prior to Admission medications   Medication Sig Start Date End Date Taking? Authorizing Provider  B-D 3CC LUER-LOK SYR 25GX1/2" 25G X 1-1/2" 3 ML MISC INJECT 1 ML Q OTHER WEEK INTO MUSCLE UTD 10/21/17  Yes [provider]  BD DISP NEEDLES 22G X 1-1/2" MISC USE UTD 10/21/17  Yes [provider]  Fish  Oil-Cholecalciferol (FISH OIL + D3 PO) Take 1 tablet by mouth daily.    Yes [provider]  FLUoxetine HCl 60 MG TABS Take 30 mg by mouth daily. Take half tab once a day 07/11/18  Yes Nche, Bonna Gains, NP  pregabalin (LYRICA) 50 MG capsule Take 1 capsule (50 mg total) by mouth 3 (three) times daily. 07/11/18  Yes Nche, Bonna Gains, NP  testosterone cypionate (DEPOTESTOTERONE CYPIONATE) 100 MG/ML injection Inject 50 mg into the muscle every 14 (fourteen) days. For IM use only    Yes [provider]  Turmeric 500 MG CAPS Take 500 mg by mouth daily.   Yes [provider]  HYDROcodone-acetaminophen (NORCO/VICODIN) 5-325 MG tablet Take 1 tablet by mouth every 4 (four) hours as needed for severe pain. Patient not taking: Reported on 07/24/2018 07/11/18   Dicky Doe, MD  sulfamethoxazole-trimethoprim (BACTRIM DS) 800-160 MG tablet Take 1 tablet by mouth 2 (two) times daily for 14 days. 07/24/18 08/07/18  Tanda Rockers, PA-C    Family History Family History  Problem Relation Age of Onset  . Fibromyalgia Mother   . Cerebral aneurysm Mother   . Hyperlipidemia Mother   . Hypertension Mother   . Stroke Mother        x3, first stroke age 56, third stroke 2013  . Depression Mother   . Autoimmune disease Mother   . Depression Father   .  Fibromyalgia Maternal Grandmother   . Arthritis Maternal Grandmother   . Diabetes Maternal Grandmother   . Depression Maternal Grandmother   . Depression Sister   . Learning disabilities Sister   . Mental illness Sister   . Healthy Brother     Social History Social History   Tobacco Use  . Smoking status: Former Smoker    Packs/day: 0.10    Years: 5.00    Pack years: 0.50    Types: Cigarettes    Last attempt to quit: 05/2017    Years since quitting: 1.1  . Smokeless tobacco: Never Used  . Tobacco comment: 3 cigs daily  Substance Use Topics  . Alcohol use: Yes    Comment: social  . Drug use: Yes    Types: Marijuana     Comment: daily     Allergies   Ginger and Pyridium [phenazopyridine hcl]   Review of Systems Review of Systems  Constitutional: Negative for chills and fever.  HENT: Negative for congestion.   Eyes: Negative for visual disturbance.  Respiratory: Negative for cough and shortness of breath.   Cardiovascular: Negative for chest pain.  Gastrointestinal: Positive for nausea. Negative for abdominal pain, constipation, diarrhea and vomiting.  Genitourinary: Positive for dysuria and flank pain (Right). Negative for frequency and hematuria.  Musculoskeletal: Negative for myalgias.  Skin: Negative for rash.  Neurological: Negative for headaches.     Physical Exam Updated Vital Signs BP 124/87 (BP Location: Left Arm)   Pulse 96   Temp 98.7 F (37.1 C) (Oral)   Resp 15   Ht  (1.6 m)   Wt 74.8 kg   LMP  (LMP Unknown)   SpO2 99%   BMI 29.23 kg/m   Physical Exam Vitals signs and nursing note reviewed.  Constitutional:      Appearance: He is not ill-appearing.  HENT:     Head: Normocephalic and atraumatic.  Eyes:     Conjunctiva/sclera: Conjunctivae normal.  Neck:     Musculoskeletal: Neck supple.  Cardiovascular:     Rate and Rhythm: Normal rate and regular rhythm.     Pulses: Normal pulses.  Pulmonary:     Effort: Pulmonary effort is normal.     Breath sounds: Normal breath sounds. No wheezing, rhonchi or rales.  Abdominal:     Palpations: Abdomen is soft.     Tenderness: There is no abdominal tenderness. There is right CVA tenderness. There is no left CVA tenderness, guarding or rebound.  Skin:    General: Skin is warm and dry.  Neurological:     Mental Status: He is alert.      ED Treatments / Results  Labs (all labs ordered are listed, but only abnormal results are displayed) Labs Reviewed  URINALYSIS, ROUTINE W REFLEX MICROSCOPIC - Abnormal; Notable for the following components:      Result Value   APPearance HAZY (*)    Hgb urine dipstick MODERATE  (*)    Leukocytes,Ua MODERATE (*)    Bacteria, UA RARE (*)    All other components within normal limits  COMPREHENSIVE METABOLIC PANEL - Abnormal; Notable for the following components:   Calcium 8.8 (*)    Total Protein 8.4 (*)    All other components within normal limits  URINE CULTURE  CBC  LIPASE, BLOOD  PREGNANCY, URINE    EKG None  Radiology US Renal  Result Date: 07/24/2018 CLINICAL DATA:  Right flank pain for 5 hours. EXAM: RENAL / URINARY TRACT ULTRASOUND COMPLETE COMPARISON:  Abdominal CT 07/11/2018. FINDINGS: Right Kidney: Renal measurements: 9.5 x 4.6 x 4.0 cm = volume: 92.8 mL . Echogenicity within normal limits. No mass or hydronephrosis visualized. Left Kidney: Renal measurements: 10.1 x 5.2 x 3.9 cm = volume: 107.3 mL. Echogenicity within normal limits. No mass or hydronephrosis visualized. Bladder: Appears normal for the degree of bladder distention. Bilateral ureteral jets are visualized. IMPRESSION: Normal renal ultrasound. Bilateral ureteral jets are visualized, implying ureteral patency. Electronically Signed   By: Carey BullocksWilliam  Veazey M.D.   On: 07/24/2018 13:18    Procedures Procedures (including critical care time)  Medications Ordered in ED Medications  sodium chloride 0.9 % bolus 1,000 mL (0 mLs Intravenous Stopped 07/24/18 1017)  ondansetron (ZOFRAN) injection 4 mg (4 mg Intravenous Given 07/24/18 0935)  ketorolac (TORADOL) 30 MG/ML injection 30 mg (30 mg Intravenous Given 07/24/18 0937)  HYDROcodone-acetaminophen (NORCO/VICODIN) 5-325 MG per tablet 1 tablet (1 tablet Oral Given 07/24/18 1201)     Initial Impression / Assessment and Plan / ED Course  I have reviewed the triage vital signs and the nursing notes.  Pertinent labs & imaging results that were available during my care of the patient were reviewed by me and considered in my medical decision making (see chart for details).    Pt is a 23 year old transgender female (female to female) who presents with 1  day of right sided flank pain. Seen in the ED 2 weeks ago for left sided pain; diagnosed with kidney stone. On CT scan from 05/13 it showed: There are several nonobstructing right renal calculi measure up to 4 mm in the inferior pole of the right kidney. Pt likely passing these at this time. Will provide 1 L NS bolus, antiemetics, and pain medication with Toradol today. Baseline labs ordered as well including CBC, CMP, lipase, U/A. Will proceed with renal ultrasound today to limit radiation exposure. If hydronephrosis is found on ultrasound will treat for kidney stones; if no findings will need to consider other etiology. Pt without RLQ abdominal pain on exam; no peritoneal signs as well; low suspicion for appendicitis.   Upon reevaluation pt reports pain has subsided and nausea has improved with antiemetics as well. CBC without leukocytosis to suggest infection process; pt also afebrile in the ED today. U/A with moderate leuks, 11-20 WBC per HPF, and moderate Hgb present. Concern for pyelo given UTI findings as well as right flank pain; still awaiting ultrasound.   Ultrasound without findings of hydronephrosis; feel confident this is pyelo; will treat outpatient with Bactrim; advised patient to take Ibuprofen and Tylenol PRN for pain and to follow up with PCP. Pt is in agreement with plan and stable for discharge home. Urine culture sent for sensitivity.        Final Clinical Impressions(s) / ED Diagnoses   Final diagnoses:  Flank pain  Acute cystitis without hematuria  Pyelonephritis    ED Discharge Orders         Ordered    sulfamethoxazole-trimethoprim (BACTRIM DS) 800-160 MG tablet  2 times daily     07/24/18 1335           Tanda RockersVenter, Oreatha Fabry, PA-C 07/24/18 1508    Azalia Bilisampos, Kevin, MD 07/24/18 [phone]

## 2018-07-25 LAB — URINE CULTURE

## 2018-07-29 ENCOUNTER — Other Ambulatory Visit: Payer: Self-pay

## 2018-07-29 ENCOUNTER — Emergency Department (HOSPITAL_COMMUNITY): Payer: BLUE CROSS/BLUE SHIELD

## 2018-07-29 ENCOUNTER — Emergency Department (HOSPITAL_COMMUNITY)
Admission: EM | Admit: 2018-07-29 | Discharge: 2018-07-29 | Disposition: A | Discharge status: Home / Self Care | Payer: BLUE CROSS/BLUE SHIELD | Attending: Emergency Medicine | Admitting: Emergency Medicine

## 2018-07-29 ENCOUNTER — Encounter (HOSPITAL_COMMUNITY): Payer: Self-pay | Admitting: Emergency Medicine

## 2018-07-29 DIAGNOSIS — R0602 Shortness of breath: Secondary | ICD-10-CM | POA: Insufficient documentation

## 2018-07-29 DIAGNOSIS — Z20828 Contact with and (suspected) exposure to other viral communicable diseases: Secondary | ICD-10-CM | POA: Insufficient documentation

## 2018-07-29 DIAGNOSIS — R0789 Other chest pain: Secondary | ICD-10-CM | POA: Insufficient documentation

## 2018-07-29 DIAGNOSIS — Z79899 Other long term (current) drug therapy: Secondary | ICD-10-CM | POA: Insufficient documentation

## 2018-07-29 DIAGNOSIS — Z87891 Personal history of nicotine dependence: Secondary | ICD-10-CM | POA: Diagnosis not present

## 2018-07-29 DIAGNOSIS — R000 Tachycardia, unspecified: Secondary | ICD-10-CM | POA: Diagnosis not present

## 2018-07-29 LAB — URINALYSIS, ROUTINE W REFLEX MICROSCOPIC
Bilirubin Urine: NEGATIVE
Glucose, UA: NEGATIVE mg/dL
Hgb urine dipstick: NEGATIVE
Ketones, ur: NEGATIVE mg/dL
Leukocytes,Ua: NEGATIVE
Nitrite: NEGATIVE
Protein, ur: NEGATIVE mg/dL
Specific Gravity, Urine: 1.02 (ref 1.005–1.030)
pH: 6 (ref 5.0–8.0)

## 2018-07-29 LAB — CBC WITH DIFFERENTIAL/PLATELET
Abs Immature Granulocytes: 0.03 10*3/uL (ref 0.00–0.07)
Basophils Absolute: 0 10*3/uL (ref 0.0–0.1)
Basophils Relative: 1 %
Eosinophils Absolute: 0.1 10*3/uL (ref 0.0–0.5)
Eosinophils Relative: 2 %
HCT: 43.7 % (ref 36.0–46.0)
Hemoglobin: 14.2 g/dL (ref 12.0–15.0)
Immature Granulocytes: 1 %
Lymphocytes Relative: 26 %
Lymphs Abs: 1.6 10*3/uL (ref 0.7–4.0)
MCH: 28.6 pg (ref 26.0–34.0)
MCHC: 32.5 g/dL (ref 30.0–36.0)
MCV: 87.9 fL (ref 80.0–100.0)
Monocytes Absolute: 0.5 10*3/uL (ref 0.1–1.0)
Monocytes Relative: 7 %
Neutro Abs: 3.9 10*3/uL (ref 1.7–7.7)
Neutrophils Relative %: 63 %
Platelets: 374 10*3/uL (ref 150–400)
RBC: 4.97 MIL/uL (ref 3.87–5.11)
RDW: 13.2 % (ref 11.5–15.5)
WBC: 6.1 10*3/uL (ref 4.0–10.5)
nRBC: 0 % (ref 0.0–0.2)

## 2018-07-29 LAB — COMPREHENSIVE METABOLIC PANEL
ALT: 33 U/L (ref 0–44)
AST: 22 U/L (ref 15–41)
Albumin: 4.3 g/dL (ref 3.5–5.0)
Alkaline Phosphatase: 70 U/L (ref 38–126)
Anion gap: 8 (ref 5–15)
BUN: 15 mg/dL (ref 6–20)
CO2: 23 mmol/L (ref 22–32)
Calcium: 9.2 mg/dL (ref 8.9–10.3)
Chloride: 105 mmol/L (ref 98–111)
Creatinine, Ser: 0.9 mg/dL (ref 0.44–1.00)
GFR calc Af Amer: >60 mL/min (ref >60)
GFR calc non Af Amer: >60 mL/min (ref >60)
Glucose, Bld: 106 mg/dL — ABNORMAL HIGH (ref 70–99)
Potassium: 4.1 mmol/L (ref 3.5–5.1)
Sodium: 136 mmol/L (ref 135–145)
Total Bilirubin: 0.3 mg/dL (ref 0.3–1.2)
Total Protein: 8.2 g/dL — ABNORMAL HIGH (ref 6.5–8.1)

## 2018-07-29 LAB — D-DIMER, QUANTITATIVE: D-Dimer, Quant: 0.31 ug/mL-FEU (ref 0.00–0.50)

## 2018-07-29 LAB — LIPASE, BLOOD: Lipase: 34 U/L (ref 11–51)

## 2018-07-29 LAB — SARS CORONAVIRUS 2 BY RT PCR (HOSPITAL ORDER, PERFORMED IN ~~LOC~~ HOSPITAL LAB): SARS Coronavirus 2: NEGATIVE

## 2018-07-29 MED ORDER — IBUPROFEN 200 MG PO TABS
600.0000 mg | ORAL_TABLET | Freq: Once | ORAL | Status: AC
  Administered 2018-07-29: 600 mg via ORAL
  Filled 2018-07-29: qty 3

## 2018-07-29 MED ORDER — SODIUM CHLORIDE 0.9 % IV BOLUS
1000.0000 mL | Freq: Once | INTRAVENOUS | Status: AC
  Administered 2018-07-29: 1000 mL via INTRAVENOUS

## 2018-07-29 NOTE — ED Notes (Signed)
Pt uses walker to restroom alone

## 2018-07-29 NOTE — ED Notes (Addendum)
UA collected and in room

## 2018-07-29 NOTE — ED Triage Notes (Signed)
Pt from home reports that she has SOB onset yesterday but worsened this morning. Denies chest pain or pain of any kind. Denies Hx asthma or bronchitis denies cough , or fever but admits to N/V and constipation.

## 2018-07-29 NOTE — ED Provider Notes (Signed)
[facility]-EMERGENCY DEPT Provider Note   CSN: [id number] Arrival date & time: 07/29/18  0609    History   Chief Complaint Chief Complaint  Patient presents with  . Shortness of Breath    HPI Shelia Walker is a 23 y.o. adult who is transgender (F to [place]) who presents to the ED complaining of gradual onset, constant, worsening, shortness of breath that began last night. Pt also complains of a heaviness in the chest mostly on the right side and 1 episode of vomiting this morning. Pt recently seen in the ED on 05/25 for right flank pain, diagnosed with pyelo and is currently on Bactrim. Reports that that pain has resolved; no longer having urinary symptoms. Has not taken anything for the pain today. No recent prolonged travel or immobilization. No hx DVT/PE. No estrogen therapy; currently on testosterone. No hemoptysis. Denies fever, chills, cough, abdominal pain, urinary sx.        Past Medical History:  Diagnosis Date  . Anxiety   . Depression   . Migraines   . PTSD (post-traumatic stress disorder)   . Seizures (HCC)   . Transgender     Patient Active Problem List   Diagnosis Date Noted  . Acute pain of right knee 06/06/2018  . Vitamin D deficiency 05/04/2018  . Positive ANA (antinuclear antibody) 11/07/2017  . 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 Severe episode of recurrent major depressive disorder, without psychotic features (HCC) 11/04/2017  . Myalgia 11/04/2017    Past Surgical History:  Procedure Laterality Date  . WISDOM TOOTH EXTRACTION       OB History   No obstetric history on file.      Home Medications    Prior to Admission medications   Medication Sig Start Date End Date Taking? Authorizing Provider  B-D 3CC LUER-LOK SYR 25GX1/2" 25G X 1-1/2" 3 ML MISC INJECT 1 ML Q OTHER WEEK INTO MUSCLE UTD 10/21/17  Yes [provider]  BD DISP NEEDLES 22G X 1-1/2" MISC USE UTD 10/21/17  Yes [provider]  Fish  Oil-Cholecalciferol (FISH OIL + D3 PO) Take 1 tablet by mouth daily.    Yes [provider]  FLUoxetine HCl 60 MG TABS Take 30 mg by mouth daily. Take half tab once a day 07/11/18  Yes Nche, Bonna Gains, NP  pregabalin (LYRICA) 50 MG capsule Take 1 capsule (50 mg total) by mouth 3 (three) times daily. 07/11/18  Yes Nche, Bonna Gains, NP  sulfamethoxazole-trimethoprim (BACTRIM DS) 800-160 MG tablet Take 1 tablet by mouth 2 (two) times daily for 14 days. 07/24/18 08/07/18 Yes Zurisadai Helminiak, PA-C  testosterone cypionate (DEPOTESTOTERONE CYPIONATE) 100 MG/ML injection Inject 50 mg into the muscle every 14 (fourteen) days. For IM use only    Yes [provider]  Turmeric 500 MG CAPS Take 500 mg by mouth daily.   Yes [provider]  HYDROcodone-acetaminophen (NORCO/VICODIN) 5-325 MG tablet Take 1 tablet by mouth every 4 (four) hours as needed for severe pain. Patient not taking: Reported on 07/24/2018 07/11/18   Dicky Doe, MD    Family History Family History  Problem Relation Age of Onset  . Fibromyalgia Mother   . Cerebral aneurysm Mother   . Hyperlipidemia Mother   . Hypertension Mother   . Stroke Mother        x3, first stroke age 48, third stroke 2013  . Depression Mother   . Autoimmune disease Mother   . Depression Father   . Fibromyalgia  Maternal Grandmother   . Arthritis Maternal Grandmother   . Diabetes Maternal Grandmother   . Depression Maternal Grandmother   . Depression Sister   . Learning disabilities Sister   . Mental illness Sister   . Healthy Brother     Social History Social History   Tobacco Use  . Smoking status: Former Smoker    Packs/day: 0.10    Years: 5.00    Pack years: 0.50    Types: Cigarettes    Last attempt to quit: 05/2017    Years since quitting: 1.1  . Smokeless tobacco: Never Used  . Tobacco comment: 3 cigs daily  Substance Use Topics  . Alcohol use: Yes    Comment: social  . Drug use: Yes    Types: Marijuana     Comment: daily     Allergies   Ginger and Pyridium [phenazopyridine hcl]   Review of Systems Review of Systems  Constitutional: Negative for chills and fever.  HENT: Negative for congestion and sore throat.   Eyes: Negative for redness.  Respiratory: Positive for shortness of breath. Negative for cough.   Cardiovascular: Positive for chest pain. Negative for palpitations and leg swelling.  Gastrointestinal: Positive for nausea and vomiting. Negative for abdominal pain, constipation and diarrhea.  Genitourinary: Negative for dysuria and frequency.  Musculoskeletal: Negative for back pain.  Skin: Negative for rash.  Neurological: Negative for headaches.     Physical Exam Updated Vital Signs BP (!) 143/94   Pulse 100   Temp 98.4 F (36.9 C) (Oral)   Resp 16   LMP  (LMP Unknown)   SpO2 99%   Physical Exam Vitals signs and nursing note reviewed.  Constitutional:      Appearance: He is not ill-appearing.     Comments: Sitting comfortably in bed; speaking in full sentences  HENT:     Head: Normocephalic and atraumatic.     Mouth/Throat:     Mouth: Mucous membranes are moist.  Eyes:     Conjunctiva/sclera: Conjunctivae normal.  Neck:     Musculoskeletal: Neck supple.  Cardiovascular:     Rate and Rhythm: Normal rate and regular rhythm.     Pulses: Normal pulses.  Pulmonary:     Effort: Pulmonary effort is normal. Tachypnea present. No accessory muscle usage.     Breath sounds: Normal breath sounds. No decreased breath sounds, wheezing, rhonchi or rales.  Chest:     Chest wall: Tenderness present.     Comments: TTP along sternum  Abdominal:     General: Abdomen is flat.     Palpations: Abdomen is soft.     Tenderness: There is abdominal tenderness. Negative signs include Murphy's sign and McBurney's sign.     Comments: Mild tenderness to epigastrium with palpation; no rebound or guarding  Musculoskeletal:     Right lower leg: No edema.     Left lower leg: No  edema.  Skin:    General: Skin is warm and dry.  Neurological:     Mental Status: He is alert.      ED Treatments / Results  Labs (all labs ordered are listed, but only abnormal results are displayed) Labs Reviewed  COMPREHENSIVE METABOLIC PANEL - Abnormal; Notable for the following components:      Result Value   Glucose, Bld 106 (*)    Total Protein 8.2 (*)    All other components within normal limits  URINALYSIS, ROUTINE W REFLEX MICROSCOPIC - Abnormal; Notable for the following components:  APPearance HAZY (*)    All other components within normal limits  SARS CORONAVIRUS 2 (HOSPITAL ORDER, PERFORMED IN [facility])  CBC WITH DIFFERENTIAL/PLATELET  LIPASE, BLOOD  D-DIMER, QUANTITATIVE (NOT [facility])    EKG EKG Interpretation  Date/Time:  Saturday Jul 29 2018 06:22:16 EDT Ventricular Rate:  100 PR Interval:    QRS Duration: 82 QT Interval:  337 QTC Calculation: 435 R Axis:   56 Text Interpretation:  Sinus tachycardia Rate is faster Confirmed by Paula LibraMolpus, John ([id number]) on 07/29/2018 6:29:31 AM   Radiology Dg Chest 2 View  Result Date: 07/29/2018 CLINICAL DATA:  Shortness of breath beginning yesterday and worsening today. EXAM: CHEST - 2 VIEW COMPARISON:  None. FINDINGS: There is overlying artifact. Heart size is normal. Mediastinal shadows are normal. The lungs are clear. No bronchial thickening. No infiltrate, mass, effusion or collapse. Pulmonary vascularity is normal. No bony abnormality. IMPRESSION: Allowing for overlying artifact, normal chest Electronically Signed   By: Paulina FusiMark  Shogry M.D.   On: 07/29/2018 06:59    Procedures Procedures (including critical care time)  Medications Ordered in ED Medications  ibuprofen (ADVIL) tablet 600 mg (600 mg Oral Given 07/29/18 0752)  sodium chloride 0.9 % bolus 1,000 mL (0 mLs Intravenous Stopped 07/29/18 0934)     Initial Impression / Assessment and Plan / ED Course  I have reviewed the triage vital signs  and the nursing notes.  Pertinent labs & imaging results that were available during my care of the patient were reviewed by me and considered in my medical decision making (see chart for details).    Pt is a 23 year old female-to-female with complaints of SOB and chest heaviness. Seen 05/12 for left flank pain, nausea, vomiting. Diagnosed with kidney stones seen on CT scan; seen again on 05/25 for right flank pain, nausea, vomiting. Ultrasound negative for kidney stones at that time (CT not obtained due to already visualized kidney stones on right from previous CT scan). Pt diagnosed with pyelo at that time by this provider; given bactrim which he is currently still taking. Flank pain has resolved with abx. Pt has TTP along sternum today as well as epigastrium; no peritoneal signs; will obtain baseline bloodwork including CBC, CMP, lipase, and U/A. Do not feel patient needs imaging of abdomen at this time. EKG and CXR initiated in triage; no signs of ischemia or pulmonary process on xray. Pt sitting comfortably in bed; no increased work of breathing; satting 100% on RA. No hx asthma. No PE risk factors although slightly tachycardic at 100; cannot PERC rule out at this time. Will obtain D dimer as well although low suspicion. Ibuprofen given for pain and 1 L NS bolus initiated as pt reported 1 episode of emesis prior to arrival. Cephaid covid test obtained as patient has been in the ED multiple times in the past month and now complaining of SOB; no known covid 19 exposures. Will reevaluate once labs and imaging return.  Upon reevaluation patient laying comfortably in bed sleeping in darkened room. Reports he is ready to go home. States the pain has improved with Ibuprofen; labwork very reassuring today. No leukocytosis to suggest infection. H&H stable. Lipase negative. D-dimer negative as well as covid test. U/A clear compared to last on 05/25. Unsure what is causing patient's complaint of shortness of breath  but feel as though emergent causes have been ruled out at this time. Advised to continue taking Bactrim from last ED visit and to finish course. Advised  to follow up with PCP as well. Pt is in agreement with plan at this time and stable for discharge home.     Shelia Walker was evaluated in Emergency Department on 07/29/2018 for the symptoms described in the history of present illness. He was evaluated in the context of the global COVID-19 pandemic, which necessitated consideration that the patient might be at risk for infection with the SARS-CoV-2 virus that causes COVID-19. Institutional protocols and algorithms that pertain to the evaluation of patients at risk for COVID-19 are in a state of rapid change based on information released by regulatory bodies including the CDC and federal and state organizations. These policies and algorithms were followed during the patient's care in the ED.        Final Clinical Impressions(s) / ED Diagnoses   Final diagnoses:  SOB (shortness of breath)    ED Discharge Orders    None       Tanda Rockers, PA-C 07/29/18 1524    Geoffery Lyons, MD 07/30/18 [phone]

## 2018-07-29 NOTE — Discharge Instructions (Signed)
You were seen in the ED today for shortness of breath; your labs and chest xray were very reassuring today. You were also tested for covid 19 which was negative. Please follow up with your PCP regarding your ED visit. Please also continue taking your antibiotics that were prescribed at your last ED visit. Your urine looked clear today compared to last but it is important to finish the full course to prevent issues in the future.

## 2018-08-07 DIAGNOSIS — F640 Transsexualism: Secondary | ICD-10-CM | POA: Diagnosis not present

## 2018-08-08 DIAGNOSIS — Z79899 Other long term (current) drug therapy: Secondary | ICD-10-CM | POA: Diagnosis not present

## 2018-08-08 DIAGNOSIS — Z114 Encounter for screening for human immunodeficiency virus [HIV]: Secondary | ICD-10-CM | POA: Diagnosis not present

## 2018-08-08 DIAGNOSIS — Z113 Encounter for screening for infections with a predominantly sexual mode of transmission: Secondary | ICD-10-CM | POA: Diagnosis not present

## 2018-08-14 ENCOUNTER — Ambulatory Visit: Payer: BLUE CROSS/BLUE SHIELD | Admitting: Nurse Practitioner

## 2018-08-15 ENCOUNTER — Other Ambulatory Visit: Payer: Self-pay

## 2018-08-15 ENCOUNTER — Encounter: Payer: Self-pay | Admitting: Nurse Practitioner

## 2018-08-15 ENCOUNTER — Ambulatory Visit (INDEPENDENT_AMBULATORY_CARE_PROVIDER_SITE_OTHER): Payer: BC Managed Care – PPO | Admitting: Nurse Practitioner

## 2018-08-15 VITALS — BP 134/78 | HR 92 | Temp 98.3°F | Ht 63.0 in | Wt 174.2 lb

## 2018-08-15 DIAGNOSIS — F332 Major depressive disorder, recurrent severe without psychotic features: Secondary | ICD-10-CM | POA: Diagnosis not present

## 2018-08-15 DIAGNOSIS — M791 Myalgia, unspecified site: Secondary | ICD-10-CM | POA: Diagnosis not present

## 2018-08-15 MED ORDER — GABAPENTIN 100 MG PO CAPS: ORAL_CAPSULE | ORAL | 1 refills | Status: DC

## 2018-08-15 NOTE — Progress Notes (Signed)
Subjective:  Patient ID: Shelia Walker, adult    DOB: March 22, 1995  Age: 23 y.o. MRN: [id number]  CC: Follow-up (pt is here for follow up--still has pain--med is not helping--depression is the same--)  HPI  Myalgia: No improvement with lyrica 150mg /day. Has muscle biopsy scheduled with [facility].  Depression:  no change with prozac and cymbalta Unable to tolerate elavil.  no SI.  Reviewed past Medical, Social and Family history today.  Outpatient Medications Prior to Visit  Medication Sig Dispense Refill  . B-D 3CC LUER-LOK SYR 25GX1/2" 25G X 1-1/2" 3 ML MISC INJECT 1 ML Q OTHER WEEK INTO MUSCLE UTD  1  . BD DISP NEEDLES 22G X 1-1/2" MISC USE UTD  1  . Fish Oil-Cholecalciferol (FISH OIL + D3 PO) Take 1 tablet by mouth daily.     Marland Kitchen FLUoxetine HCl 60 MG TABS Take 30 mg by mouth daily. Take half tab once a day 45 tablet 1  . testosterone cypionate (DEPOTESTOTERONE CYPIONATE) 100 MG/ML injection Inject 50 mg into the muscle every 14 (fourteen) days. For IM use only     . Turmeric 500 MG CAPS Take 500 mg by mouth daily.    . pregabalin (LYRICA) 50 MG capsule Take 1 capsule (50 mg total) by mouth 3 (three) times daily. 90 capsule 2  . HYDROcodone-acetaminophen (NORCO/VICODIN) 5-325 MG tablet Take 1 tablet by mouth every 4 (four) hours as needed for severe pain. (Patient not taking: Reported on 07/24/2018) 12 tablet 0   No facility-administered medications prior to visit.     ROS Review of Systems  Neurological: Positive for sensory change and weakness. Negative for dizziness, tingling, focal weakness and headaches.  Psychiatric/Behavioral: Positive for depression. Negative for substance abuse and suicidal ideas. The patient is nervous/anxious. The patient does not have insomnia.     Objective:  BP 134/78   Pulse 92   Temp 98.3 F (36.8 C) (Oral)   Ht 5\' 3"  (1.6 m)   Wt 174 lb 3.2 oz (79 kg)   LMP  (LMP Unknown)   SpO2 99%   BMI 30.86 kg/m   BP Readings from Last 3  Encounters:  08/15/18 134/78  07/29/18 111/74  07/24/18 107/63    Wt Readings from Last 3 Encounters:  08/15/18 174 lb 3.2 oz (79 kg)  07/24/18 165 lb (74.8 kg)  07/11/18 165 lb 12.8 oz (75.2 kg)    Physical Exam Vitals signs reviewed.  Constitutional:      Appearance: He is obese.  Cardiovascular:     Rate and Rhythm: Normal rate.  Pulmonary:     Effort: Pulmonary effort is normal.  Musculoskeletal:     Right lower leg: No edema.     Left lower leg: No edema.  Neurological:     Mental Status: He is oriented to person, place, and time.     Lab Results  Component Value Date   WBC 6.1 07/29/2018   HGB 14.2 07/29/2018   HCT 43.7 07/29/2018   PLT 374 07/29/2018   GLUCOSE 106 (H) 07/29/2018   ALT 33 07/29/2018   AST 22 07/29/2018   NA 136 07/29/2018   K 4.1 07/29/2018   CL 105 07/29/2018   CREATININE 0.90 07/29/2018   BUN 15 07/29/2018   CO2 23 07/29/2018   TSH 1.80 11/04/2017   HGBA1C 5.7 11/04/2017    Dg Chest 2 View  Result Date: 07/29/2018 CLINICAL DATA:  Shortness of breath beginning yesterday and worsening today. EXAM: CHEST - 2  VIEW COMPARISON:  None. FINDINGS: There is overlying artifact. Heart size is normal. Mediastinal shadows are normal. The lungs are clear. No bronchial thickening. No infiltrate, mass, effusion or collapse. Pulmonary vascularity is normal. No bony abnormality. IMPRESSION: Allowing for overlying artifact, normal chest Electronically Signed   By: Paulina FusiMark  Shogry M.D.   On: 07/29/2018 06:59    Assessment & Plan:   Shelia Walker was seen today for follow-up.  Diagnoses and all orders for this visit:  Myalgia -     gabapentin (NEURONTIN) 100 MG capsule; Take 2caps at hs x 3days, then 1cap AM & noon and 2caps at hs continous  Severe episode of recurrent major depressive disorder, without psychotic features (HCC) -     Ambulatory referral to Psychiatry   I have discontinued Shelia Walker "TJ"'s pregabalin and HYDROcodone-acetaminophen. I am  also having him start on gabapentin. Additionally, I am having him maintain his BD Disp Needles, B-D 3CC LUER-LOK SYR 25GX1/2", Turmeric, Fish Oil-Cholecalciferol (FISH OIL + D3 PO), FLUoxetine HCl, and testosterone cypionate.  Meds ordered this encounter  Medications  . gabapentin (NEURONTIN) 100 MG capsule    Sig: Take 2caps at hs x 3days, then 1cap AM & noon and 2caps at hs continous    Dispense:  50 capsule    Refill:  1    Order Specific Question:   Supervising Provider    Answer:   Dianne DunARON, TALIA M [[id number]]    Problem List Items Addressed This Visit      Other   Myalgia - Primary    No improvement with lyrica 50mg  TID. appt with [facility] in 1.5week  Stop lyrica.  start gabapentin.      Relevant Medications   gabapentin (NEURONTIN) 100 MG capsule   Severe episode of recurrent major depressive disorder, without psychotic features [facility](HCC)   Relevant Orders   Ambulatory referral to Psychiatry       Follow-up: Return in about 4 weeks (around 09/12/2018) for myalgia and depression.  Shelia Pennaharlotte Yajayra Feldt, NP

## 2018-08-15 NOTE — Assessment & Plan Note (Signed)
No improvement with lyrica 50mg  TID. appt with [facility] in [place]  Stop lyrica.  start gabapentin.

## 2018-08-15 NOTE — Patient Instructions (Signed)
You will be contacted to schedule appt with psychiatry. Stop lyrica. Start gabapentin.

## 2018-08-16 ENCOUNTER — Encounter: Payer: Self-pay | Admitting: Nurse Practitioner

## 2018-08-18 ENCOUNTER — Encounter (HOSPITAL_COMMUNITY): Payer: Self-pay | Admitting: Emergency Medicine

## 2018-08-18 ENCOUNTER — Emergency Department (HOSPITAL_COMMUNITY)
Admission: EM | Admit: 2018-08-18 | Discharge: 2018-08-18 | Disposition: A | Discharge status: Home / Self Care | Payer: BC Managed Care – PPO | Attending: Emergency Medicine | Admitting: Emergency Medicine

## 2018-08-18 ENCOUNTER — Other Ambulatory Visit: Payer: Self-pay

## 2018-08-18 ENCOUNTER — Emergency Department (HOSPITAL_COMMUNITY): Payer: BC Managed Care – PPO

## 2018-08-18 DIAGNOSIS — R109 Unspecified abdominal pain: Secondary | ICD-10-CM | POA: Insufficient documentation

## 2018-08-18 DIAGNOSIS — F649 Gender identity disorder, unspecified: Secondary | ICD-10-CM | POA: Insufficient documentation

## 2018-08-18 DIAGNOSIS — Z79899 Other long term (current) drug therapy: Secondary | ICD-10-CM | POA: Insufficient documentation

## 2018-08-18 DIAGNOSIS — R110 Nausea: Secondary | ICD-10-CM | POA: Diagnosis not present

## 2018-08-18 DIAGNOSIS — Z87891 Personal history of nicotine dependence: Secondary | ICD-10-CM | POA: Insufficient documentation

## 2018-08-18 LAB — URINALYSIS, ROUTINE W REFLEX MICROSCOPIC
Bilirubin Urine: NEGATIVE
Glucose, UA: NEGATIVE mg/dL
Hgb urine dipstick: NEGATIVE
Ketones, ur: NEGATIVE mg/dL
Nitrite: NEGATIVE
Protein, ur: NEGATIVE mg/dL
Specific Gravity, Urine: 1.016 (ref 1.005–1.030)
pH: 7 (ref 5.0–8.0)

## 2018-08-18 LAB — POC URINE PREG, ED: Preg Test, Ur: NEGATIVE

## 2018-08-18 MED ORDER — KETOROLAC TROMETHAMINE 30 MG/ML IJ SOLN
30.0000 mg | Freq: Once | INTRAMUSCULAR | Status: AC
  Administered 2018-08-18: 30 mg via INTRAMUSCULAR
  Filled 2018-08-18: qty 1

## 2018-08-18 MED ORDER — CYCLOBENZAPRINE HCL 10 MG PO TABS: 10.0000 mg | ORAL_TABLET | Freq: Two times a day (BID) | ORAL | 0 refills | Status: DC | PRN

## 2018-08-18 MED ORDER — IBUPROFEN 600 MG PO TABS: 600.0000 mg | ORAL_TABLET | Freq: Four times a day (QID) | ORAL | 0 refills | Status: AC | PRN

## 2018-08-18 NOTE — Discharge Instructions (Signed)
Take ibuprofen or Flexeril as needed for pain.  Return if you develop burning sensation when urinate, blood in your urine, fever or if you have any other concern.

## 2018-08-18 NOTE — ED Notes (Signed)
US Renal at bedside

## 2018-08-18 NOTE — ED Triage Notes (Signed)
C/o right flank pain since last night. Hx kidney stones. Denies v/d or urinary problems. Reports nausea.

## 2018-08-18 NOTE — ED Provider Notes (Signed)
[facility]-EMERGENCY DEPT Provider Note   CSN: [id number] Arrival date & time: 08/18/18  [id number]     History   Chief Complaint Chief Complaint  Patient presents with  . Flank Pain    HPI Shelia Walker is a 23 y.o. adult.     The history is provided by the patient and medical records. No language interpreter was used.  Flank Pain     23 year old transgender female to female with known history of kidney stones presenting for evaluation of flank pain.  Patient report he developed pain to his right flank since last night.  Pain is a dull but palpable sensation now radiates to his left flank.  Pain seems to be persistent and worsening with movement and felt very similar to prior kidney stone pain.  Does endorse nausea without vomiting.  No fever, chills, chest pain, trouble breathing, productive cough, abdominal pain, dysuria, hematuria.  Denies any heavy lifting or strenuous activity.  No specific treatment tried.  No prior complication of kidney stones  Past Medical History:  Diagnosis Date  . Anxiety   . Depression   . Migraines   . PTSD (post-traumatic stress disorder)   . Seizures (HCC)   . Transgender     Patient Active Problem List   Diagnosis Date Noted  . Acute pain of right knee 06/06/2018  . Vitamin D deficiency 05/04/2018  . Positive ANA (antinuclear antibody) 11/07/2017  . 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 Severe episode of recurrent major depressive disorder, without psychotic features (HCC) 11/04/2017  . Myalgia 11/04/2017    Past Surgical History:  Procedure Laterality Date  . WISDOM TOOTH EXTRACTION       OB History   No obstetric history on file.      Home Medications    Prior to Admission medications   Medication Sig Start Date End Date Taking? Authorizing Provider  B-D 3CC LUER-LOK SYR 25GX1/2" 25G X 1-1/2" 3 ML MISC INJECT 1 ML Q OTHER WEEK INTO MUSCLE UTD 10/21/17   [provider]  BD DISP NEEDLES  22G X 1-1/2" MISC USE UTD 10/21/17   [provider]  Fish Oil-Cholecalciferol (FISH OIL + D3 PO) Take 1 tablet by mouth daily.     [provider]  FLUoxetine HCl 60 MG TABS Take 30 mg by mouth daily. Take half tab once a day 07/11/18   Nche, Bonna Gainsharlotte Lum, NP  gabapentin (NEURONTIN) 100 MG capsule Take 2caps at hs x 3days, then 1cap AM & noon and 2caps at hs continous 08/15/18   Nche, Bonna Gainsharlotte Lum, NP  testosterone cypionate (DEPOTESTOTERONE CYPIONATE) 100 MG/ML injection Inject 50 mg into the muscle every 14 (fourteen) days. For IM use only     [provider]  Turmeric 500 MG CAPS Take 500 mg by mouth daily.    [provider]    Family History Family History  Problem Relation Age of Onset  . Fibromyalgia Mother   . Cerebral aneurysm Mother   . Hyperlipidemia Mother   . Hypertension Mother   . Stroke Mother        x3, first stroke age 22, third stroke 2013  . Depression Mother   . Autoimmune disease Mother   . Depression Father   . Fibromyalgia Maternal Grandmother   . Arthritis Maternal Grandmother   . Diabetes Maternal Grandmother   . Depression Maternal Grandmother   . Depression Sister   . Learning disabilities Sister   . Mental illness Sister   .  Healthy Brother     Social History Social History   Tobacco Use  . Smoking status: Former Smoker    Packs/day: 0.10    Years: 5.00    Pack years: 0.50    Types: Cigarettes    Quit date: 05/2017    Years since quitting: 1.2  . Smokeless tobacco: Never Used  . Tobacco comment: 3 cigs daily  Substance Use Topics  . Alcohol use: Yes    Comment: social  . Drug use: Yes    Types: Marijuana    Comment: daily     Allergies   Ginger and Pyridium [phenazopyridine hcl]   Review of Systems Review of Systems  Genitourinary: Positive for flank pain.  All other systems reviewed and are negative.    Physical Exam Updated Vital Signs BP 121/74 (BP Location: Left Arm)   Pulse 80    Temp 98 F (36.7 C) (Oral)   Resp 18   LMP  (LMP Unknown)   SpO2 100%   Physical Exam Constitutional:      General: He is not in acute distress.    Appearance: He is well-developed.     Comments: Well-appearing, sitting in bed in no acute discomfort.  HENT:     Head: Atraumatic.  Eyes:     Conjunctiva/sclera: Conjunctivae normal.  Neck:     Musculoskeletal: Normal range of motion and neck supple.  Cardiovascular:     Rate and Rhythm: Normal rate and regular rhythm.     Pulses: Normal pulses.     Heart sounds: Normal heart sounds.  Pulmonary:     Effort: Pulmonary effort is normal.     Breath sounds: Normal breath sounds.  Abdominal:     General: Abdomen is flat.     Palpations: Abdomen is soft.     Tenderness: There is no abdominal tenderness. There is right CVA tenderness. There is no left CVA tenderness.  Musculoskeletal:     Comments: No significant midline spine tenderness  Skin:    Findings: No rash.  Neurological:     Mental Status: He is alert and oriented to person, place, and time.  Psychiatric:        Mood and Affect: Mood normal.      ED Treatments / Results  Labs (all labs ordered are listed, but only abnormal results are displayed) Labs Reviewed  URINALYSIS, ROUTINE W REFLEX MICROSCOPIC - Abnormal; Notable for the following components:      Result Value   Leukocytes,Ua TRACE (*)    Bacteria, UA RARE (*)    All other components within normal limits  POC URINE PREG, ED    EKG None  Radiology US Renal  Result Date: 08/18/2018 CLINICAL DATA:  Right flank pain.  History of nephrolithiasis. EXAM: RENAL / URINARY TRACT ULTRASOUND COMPLETE COMPARISON:  07/24/2018. FINDINGS: Right Kidney: Renal measurements: 10.2 x 4.7 x 4.7 cm = volume: 119 mL . Echogenicity within normal limits. No mass or hydronephrosis visualized. Left Kidney: Renal measurements: 9.8 x 5.5 x 4.0 cm = volume: 113 mL. Echogenicity within normal limits. No mass or hydronephrosis  visualized. Bladder: Appears normal for degree of bladder distention. IMPRESSION: Normal examination. Electronically Signed   By: Claudie Revering M.D.   On: 08/18/2018 10:56    Procedures Procedures (including critical care time)  Medications Ordered in ED Medications  ketorolac (TORADOL) 30 MG/ML injection 30 mg (30 mg Intramuscular Given 08/18/18 1007)     Initial Impression / Assessment and Plan / ED Course  I have reviewed the triage vital signs and the nursing notes.  Pertinent labs & imaging results that were available during my care of the patient were reviewed by me and considered in my medical decision making (see chart for details).        BP (!) 99/56   Pulse 69   Temp 98 F (36.7 C) (Oral)   Resp 18   LMP  (LMP Unknown)   SpO2 100%    Final Clinical Impressions(s) / ED Diagnoses   Final diagnoses:  Flank pain    ED Discharge Orders         Ordered    ibuprofen (ADVIL) 600 MG tablet  Every 6 hours PRN     08/18/18 1144    cyclobenzaprine (FLEXERIL) 10 MG tablet  2 times daily PRN     08/18/18 1144         9:41 AM Patient with known history of kidney stones here with right flank pain since last night.  Patient is in no acute discomfort.  Vital signs stable.  Will check UA, obtain renal ultrasound will provide pain medication.  11:42 AM Pregnancy test is negative, urinalysis shows 11-20 WBC and trace leukocyte esterase.  She does not complain of any urinary symptoms therefore have low suspicion for UTI.  No blood in urine.  Ultrasound renal was performed without any acute finding.  Patient currently resting comfortably.  Since patient has pain with movement and rest, suspect this is more likely to be muscle skeletal pain and less likely to be nephrolithiasis.  Will discharge home with symptomatic treatment.  Return precaution discussed.  Patient voiced understanding and agreed with plan.   Fayrene Helperran, Shalana Jardin, PA-C 08/18/18 1147    Samuel JesterMcManus, Kathleen, DO 08/21/18  1455

## 2018-08-21 DIAGNOSIS — F640 Transsexualism: Secondary | ICD-10-CM | POA: Diagnosis not present

## 2018-08-22 DIAGNOSIS — Z1159 Encounter for screening for other viral diseases: Secondary | ICD-10-CM | POA: Diagnosis not present

## 2018-08-22 DIAGNOSIS — Z01818 Encounter for other preprocedural examination: Secondary | ICD-10-CM | POA: Diagnosis not present

## 2018-08-24 DIAGNOSIS — F4310 Post-traumatic stress disorder, unspecified: Secondary | ICD-10-CM | POA: Diagnosis not present

## 2018-08-24 DIAGNOSIS — Z79899 Other long term (current) drug therapy: Secondary | ICD-10-CM | POA: Diagnosis not present

## 2018-08-24 DIAGNOSIS — G40909 Epilepsy, unspecified, not intractable, without status epilepticus: Secondary | ICD-10-CM | POA: Diagnosis not present

## 2018-08-24 DIAGNOSIS — G8929 Other chronic pain: Secondary | ICD-10-CM | POA: Diagnosis not present

## 2018-08-24 DIAGNOSIS — M7918 Myalgia, other site: Secondary | ICD-10-CM | POA: Diagnosis not present

## 2018-08-24 DIAGNOSIS — G729 Myopathy, unspecified: Secondary | ICD-10-CM | POA: Diagnosis not present

## 2018-08-24 DIAGNOSIS — K219 Gastro-esophageal reflux disease without esophagitis: Secondary | ICD-10-CM | POA: Diagnosis not present

## 2018-08-24 DIAGNOSIS — E669 Obesity, unspecified: Secondary | ICD-10-CM | POA: Diagnosis not present

## 2018-08-24 DIAGNOSIS — M791 Myalgia, unspecified site: Secondary | ICD-10-CM | POA: Diagnosis not present

## 2018-08-24 DIAGNOSIS — F419 Anxiety disorder, unspecified: Secondary | ICD-10-CM | POA: Diagnosis not present

## 2018-08-24 DIAGNOSIS — Z87891 Personal history of nicotine dependence: Secondary | ICD-10-CM | POA: Diagnosis not present

## 2018-09-07 DIAGNOSIS — F640 Transsexualism: Secondary | ICD-10-CM | POA: Diagnosis not present

## 2018-09-11 ENCOUNTER — Telehealth: Payer: Self-pay | Admitting: Nurse Practitioner

## 2018-09-11 NOTE — Telephone Encounter (Signed)
Questions for Screening COVID-19  Symptom onset: n/a  Travel or Contacts: no Tested positive for Covid 19: no  During this illness, did/does the patient experience any of the following symptoms? Fever >100.80F []   Yes [x]   No []   Unknown Subjective fever (felt feverish) []   Yes [x]   No []   Unknown Chills []   Yes [x]   No []   Unknown Muscle aches (myalgia) []   Yes [x]   No []   Unknown Runny nose (rhinorrhea) []   Yes [x]   No []   Unknown Sore throat []   Yes [x]   No []   Unknown Cough (new onset or worsening of chronic cough) []   Yes [x]   No []   Unknown Shortness of breath (dyspnea) []   Yes [x]   No []   Unknown Nausea or vomiting []   Yes [x]   No []   Unknown Headache []   Yes [x]   No []   Unknown Abdominal pain  []   Yes []   No []   Unknown Diarrhea (?3 loose/looser than normal stools/24hr period) []   Yes [x]   No []   Unknown Other, specify:

## 2018-09-12 ENCOUNTER — Ambulatory Visit (INDEPENDENT_AMBULATORY_CARE_PROVIDER_SITE_OTHER): Payer: BC Managed Care – PPO | Admitting: Nurse Practitioner

## 2018-09-12 ENCOUNTER — Other Ambulatory Visit: Payer: Self-pay

## 2018-09-12 ENCOUNTER — Encounter: Payer: Self-pay | Admitting: Nurse Practitioner

## 2018-09-12 VITALS — BP 112/80 | HR 94 | Temp 98.2°F | Ht 63.0 in | Wt 174.0 lb

## 2018-09-12 DIAGNOSIS — F332 Major depressive disorder, recurrent severe without psychotic features: Secondary | ICD-10-CM | POA: Diagnosis not present

## 2018-09-12 DIAGNOSIS — M791 Myalgia, unspecified site: Secondary | ICD-10-CM

## 2018-09-12 MED ORDER — GABAPENTIN 100 MG PO CAPS: ORAL_CAPSULE | ORAL | 5 refills | Status: AC

## 2018-09-12 NOTE — Assessment & Plan Note (Signed)
Resume gabapentin  Entered referral to pain clinic Negative muscle biopsy per [facility]

## 2018-09-12 NOTE — Progress Notes (Signed)
Subjective:  Patient ID: Shelia Walker, adult    DOB: 04/17/1995  Age: 23 y.o. MRN: [id number]  CC: Follow-up (4 wk follow up--still in pain,med didnt help. )  HPI  Chronic Pain syndrome: Unsure how long she took gabapentin prior to discontinuation. She reports no change in pain while taking medication. Negative muscle biopsy Previous use of elavil, cymbalta and lyrica with no improvement. Last testosterone injection: 06/2018 per patient  Anxiety and Depression: Stable with fluoxetine 30mg  Still to make appt with psychiatry.  Reviewed past Medical, Social and Family history today.  Outpatient Medications Prior to Visit  Medication Sig Dispense Refill  . B-D 3CC LUER-LOK SYR 25GX1/2" 25G X 1-1/2" 3 ML MISC INJECT 1 ML Q OTHER WEEK INTO MUSCLE UTD  1  . BD DISP NEEDLES 22G X 1-1/2" MISC USE UTD  1  . FLUoxetine HCl 60 MG TABS Take 30 mg by mouth daily. Take half tab once a day 45 tablet 1  . ibuprofen (ADVIL) 600 MG tablet Take 1 tablet (600 mg total) by mouth every 6 (six) hours as needed. 30 tablet 0  . Testosterone Cypionate 200 MG/ML SOLN Inject 100 mg into the muscle every 14 (fourteen) days.    . cyclobenzaprine (FLEXERIL) 10 MG tablet Take 1 tablet (10 mg total) by mouth 2 (two) times daily as needed for muscle spasms. (Patient not taking: Reported on 09/12/2018) 20 tablet 0  . Fish Oil-Cholecalciferol (FISH OIL + D3 PO) Take 1 tablet by mouth daily.     . Turmeric 500 MG CAPS Take 500 mg by mouth daily.    Marland Kitchen. gabapentin (NEURONTIN) 100 MG capsule Take 2caps at hs x 3days, then 1cap AM & noon and 2caps at hs continous (Patient not taking: Reported on 09/12/2018) 50 capsule 1   No facility-administered medications prior to visit.     ROS See HPI  Objective:  BP 112/80   Pulse 94   Temp 98.2 F (36.8 C) (Oral)   Ht 5\' 3"  (1.6 m)   Wt 174 lb (78.9 kg)   SpO2 97%   BMI 30.82 kg/m   BP Readings from Last 3 Encounters:  09/12/18 112/80  08/18/18 90/64  08/15/18  134/78    Wt Readings from Last 3 Encounters:  09/12/18 174 lb (78.9 kg)  08/15/18 174 lb 3.2 oz (79 kg)  07/24/18 165 lb (74.8 kg)    Physical Exam Vitals signs reviewed.  Cardiovascular:     Rate and Rhythm: Normal rate.     Pulses: Normal pulses.  Neurological:     Mental Status: He is alert and oriented to person, place, and time.  Psychiatric:        Mood and Affect: Affect is blunt and flat.        Speech: Speech normal.        Behavior: Behavior is cooperative.     Lab Results  Component Value Date   WBC 6.1 07/29/2018   HGB 14.2 07/29/2018   HCT 43.7 07/29/2018   PLT 374 07/29/2018   GLUCOSE 106 (H) 07/29/2018   ALT 33 07/29/2018   AST 22 07/29/2018   NA 136 07/29/2018   K 4.1 07/29/2018   CL 105 07/29/2018   CREATININE 0.90 07/29/2018   BUN 15 07/29/2018   CO2 23 07/29/2018   TSH 1.80 11/04/2017   HGBA1C 5.7 11/04/2017    [place] Renal  Result Date: 08/18/2018 CLINICAL DATA:  Right flank pain.  History of nephrolithiasis. EXAM: RENAL / URINARY  TRACT ULTRASOUND COMPLETE COMPARISON:  07/24/2018. FINDINGS: Right Kidney: Renal measurements: 10.2 x 4.7 x 4.7 cm = volume: 119 mL . Echogenicity within normal limits. No mass or hydronephrosis visualized. Left Kidney: Renal measurements: 9.8 x 5.5 x 4.0 cm = volume: 113 mL. Echogenicity within normal limits. No mass or hydronephrosis visualized. Bladder: Appears normal for degree of bladder distention. IMPRESSION: Normal examination. Electronically Signed   By: Claudie Revering M.D.   On: 08/18/2018 10:56    Assessment & Plan:   Shelia Walker was seen today for follow-up.  Diagnoses and all orders for this visit:  Severe episode of recurrent major depressive disorder, without psychotic features ([place]) -     Ambulatory referral to Pain Clinic  Myalgia -     gabapentin (NEURONTIN) 100 MG capsule; Take 1cap AM & noon and 2caps at hs continous -     Ambulatory referral to Pain Clinic   I have changed Shelia "TJ"'s  gabapentin. I am also having him maintain his BD Disp Needles, B-D 3CC LUER-LOK SYR 25GX1/2", Turmeric, Fish Oil-Cholecalciferol (FISH OIL + D3 PO), FLUoxetine HCl, Testosterone Cypionate, ibuprofen, and cyclobenzaprine.  Meds ordered this encounter  Medications  . gabapentin (NEURONTIN) 100 MG capsule    Sig: Take 1cap AM & noon and 2caps at hs continous    Dispense:  120 capsule    Refill:  5    Order Specific Question:   Supervising Provider    Answer:   Lucille Passy [[id number]]    Problem List Items Addressed This Visit      Other   Myalgia   Relevant Medications   gabapentin (NEURONTIN) 100 MG capsule   Other Relevant Orders   Ambulatory referral to Pain Clinic   Severe episode of recurrent major depressive disorder, without psychotic features ([place]) - Primary   Relevant Orders   Ambulatory referral to Pain Clinic       Follow-up: Return if symptoms worsen or fail to improve.  Wilfred Lacy, NP

## 2018-09-12 NOTE — Patient Instructions (Addendum)
Please contact psychiatry for appt: [phone]  Resume gabapentin. You will be contacted to schedule appt with pain clinic.

## 2018-09-19 DIAGNOSIS — M545 Low back pain: Secondary | ICD-10-CM | POA: Diagnosis not present

## 2018-09-19 DIAGNOSIS — M791 Myalgia, unspecified site: Secondary | ICD-10-CM | POA: Diagnosis not present

## 2018-09-19 DIAGNOSIS — G8929 Other chronic pain: Secondary | ICD-10-CM | POA: Diagnosis not present

## 2018-09-19 DIAGNOSIS — Z79899 Other long term (current) drug therapy: Secondary | ICD-10-CM | POA: Diagnosis not present

## 2018-09-19 DIAGNOSIS — M2550 Pain in unspecified joint: Secondary | ICD-10-CM | POA: Diagnosis not present

## 2018-09-19 DIAGNOSIS — N915 Oligomenorrhea, unspecified: Secondary | ICD-10-CM | POA: Diagnosis not present

## 2018-09-19 DIAGNOSIS — M129 Arthropathy, unspecified: Secondary | ICD-10-CM | POA: Diagnosis not present

## 2018-09-20 DIAGNOSIS — F640 Transsexualism: Secondary | ICD-10-CM | POA: Diagnosis not present

## 2018-09-21 ENCOUNTER — Encounter: Payer: Self-pay | Admitting: Nurse Practitioner

## 2018-09-26 DIAGNOSIS — M2550 Pain in unspecified joint: Secondary | ICD-10-CM | POA: Diagnosis not present

## 2018-09-26 DIAGNOSIS — M791 Myalgia, unspecified site: Secondary | ICD-10-CM | POA: Diagnosis not present

## 2018-09-26 DIAGNOSIS — M545 Low back pain: Secondary | ICD-10-CM | POA: Diagnosis not present

## 2018-09-26 DIAGNOSIS — G8929 Other chronic pain: Secondary | ICD-10-CM | POA: Diagnosis not present

## 2018-09-26 DIAGNOSIS — N915 Oligomenorrhea, unspecified: Secondary | ICD-10-CM | POA: Diagnosis not present

## 2018-09-26 DIAGNOSIS — Z79899 Other long term (current) drug therapy: Secondary | ICD-10-CM | POA: Diagnosis not present

## 2018-11-16 ENCOUNTER — Telehealth: Payer: Self-pay | Admitting: Nurse Practitioner

## 2018-11-16 NOTE — Telephone Encounter (Signed)
PA for Fluoxitine 60 mg.

## 2018-11-16 NOTE — Telephone Encounter (Signed)
PA started, waiting for result.   Shelia Walker (Key: [id number])

## 2018-11-17 NOTE — Telephone Encounter (Signed)
Sent mychart message to make pt aware and notified pharmacy of approved PA.

## 2018-11-28 ENCOUNTER — Telehealth: Payer: Self-pay

## 2018-11-28 NOTE — Telephone Encounter (Signed)
Unfortunately I do not know of any cheaper medication. She can use GoodRx app for coupon or [facility] has it on their $4 list.

## 2018-11-28 NOTE — Telephone Encounter (Signed)
Pt stated Fluoxetine doesn't seem to work, wondering if we can change med to something else. Please advise.   FYI--pt stated copay for fluoxetine with insurance still over $100.

## 2018-11-28 NOTE — Telephone Encounter (Signed)
Copied from [place] [id number]. Topic: General - Other >> Nov 28, 2018  1:37 PM Celene Kras A wrote: Reason for CRM: Pt called and is requesting to speak with PCP or nurse regarding his fluoxetine. Pt states he cannot afford it and is requesting to know if it can be changed. Please advise.

## 2018-11-28 NOTE — Telephone Encounter (Signed)
[place] please advise 

## 2018-11-29 NOTE — Telephone Encounter (Signed)
Pt stated the appt with psychiatry is on 12/18/2018--he going to try a hold off on the fluoxetine because it is too much. He will call us if anything change.

## 2018-11-29 NOTE — Telephone Encounter (Signed)
With the different medications tried and difficulty with medication cost, I will defer to psychiatry at this point. She was provided with a number during last office visit. Please remind her to call and make an appt.

## 2019-03-25 ENCOUNTER — Other Ambulatory Visit: Payer: Self-pay

## 2019-03-25 ENCOUNTER — Emergency Department (HOSPITAL_COMMUNITY)
Admission: EM | Admit: 2019-03-25 | Discharge: 2019-03-25 | Disposition: A | Discharge status: Home / Self Care | Payer: Self-pay | Attending: Emergency Medicine | Admitting: Emergency Medicine

## 2019-03-25 ENCOUNTER — Emergency Department (HOSPITAL_COMMUNITY): Payer: Self-pay

## 2019-03-25 ENCOUNTER — Encounter (HOSPITAL_COMMUNITY): Payer: Self-pay

## 2019-03-25 DIAGNOSIS — Z87891 Personal history of nicotine dependence: Secondary | ICD-10-CM | POA: Diagnosis not present

## 2019-03-25 DIAGNOSIS — R1032 Left lower quadrant pain: Secondary | ICD-10-CM | POA: Diagnosis present

## 2019-03-25 DIAGNOSIS — N390 Urinary tract infection, site not specified: Secondary | ICD-10-CM | POA: Diagnosis not present

## 2019-03-25 DIAGNOSIS — Z79899 Other long term (current) drug therapy: Secondary | ICD-10-CM | POA: Insufficient documentation

## 2019-03-25 DIAGNOSIS — Z87442 Personal history of urinary calculi: Secondary | ICD-10-CM | POA: Insufficient documentation

## 2019-03-25 LAB — URINALYSIS, ROUTINE W REFLEX MICROSCOPIC
Bacteria, UA: NONE SEEN
Bilirubin Urine: NEGATIVE
Glucose, UA: NEGATIVE mg/dL
Hgb urine dipstick: NEGATIVE
Ketones, ur: NEGATIVE mg/dL
Nitrite: NEGATIVE
Protein, ur: 30 mg/dL — AB
Specific Gravity, Urine: 1.017 (ref 1.005–1.030)
pH: 8 (ref 5.0–8.0)

## 2019-03-25 LAB — I-STAT BETA HCG BLOOD, ED (MC, WL, AP ONLY): I-stat hCG, quantitative: <5 m[IU]/mL (ref <5)

## 2019-03-25 MED ORDER — CEPHALEXIN 500 MG PO CAPS: 500.0000 mg | ORAL_CAPSULE | Freq: Four times a day (QID) | ORAL | 0 refills | Status: AC

## 2019-03-25 NOTE — ED Triage Notes (Addendum)
C/O left/right flank pain that started 1 hour ago. 10/10 pain  Denies urinary symptoms  Denies suprapubic pain   Hx. Kidney stones and kidney infections per patient   A/Ox4 Wheelchair in triage

## 2019-03-25 NOTE — ED Notes (Signed)
Pt. Is in no distress and is calmly awaiting CT.

## 2019-03-25 NOTE — ED Provider Notes (Addendum)
[facility]-EMERGENCY DEPT Provider Note   CSN: [id number] Arrival date & time: 03/25/19  1348     History Chief Complaint  Patient presents with  . Flank Pain    Shelia Walker is a 24 y.o. adult.  24 year old female to female transgender who presents with sudden onset of left-sided flank pain.  Patient states that they have a history of kidney stones in the past.  No treatment use prior to arrival.  Pain is been colicky.  No dysuria, hematuria.  No nausea or vomiting.  Patient states that they did miss a dose of their testosterone treatment and thinks that this might be related to it.  Does not have periods due to current hormone therapy        Past Medical History:  Diagnosis Date  . Anxiety   . Depression   . Migraines   . PTSD (post-traumatic stress disorder)   . Seizures (HCC)   . Transgender     Patient Active Problem List   Diagnosis Date Noted  . Acute pain of right knee 06/06/2018  . Vitamin D deficiency 05/04/2018  . Positive ANA (antinuclear antibody) 11/07/2017  . 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 Severe episode of recurrent major depressive disorder, without psychotic features (HCC) 11/04/2017  . Myalgia 11/04/2017    Past Surgical History:  Procedure Laterality Date  . WISDOM TOOTH EXTRACTION       OB History   No obstetric history on file.     Family History  Problem Relation Age of Onset  . Fibromyalgia Mother   . Cerebral aneurysm Mother   . Hyperlipidemia Mother   . Hypertension Mother   . Stroke Mother        x3, first stroke age 61, third stroke 2013  . Depression Mother   . Autoimmune disease Mother   . Depression Father   . Fibromyalgia Maternal Grandmother   . Arthritis Maternal Grandmother   . Diabetes Maternal Grandmother   . Depression Maternal Grandmother   . Depression Sister   . Learning disabilities Sister   . Mental illness Sister   . Healthy Brother     Social History    Tobacco Use  . Smoking status: Former Smoker    Packs/day: 0.10    Years: 5.00    Pack years: 0.50    Types: Cigarettes    Quit date: 05/2017    Years since quitting: 1.8  . Smokeless tobacco: Never Used  . Tobacco comment: 3 cigs daily  Substance Use Topics  . Alcohol use: Yes    Comment: social  . Drug use: Yes    Types: Marijuana    Comment: daily    Home Medications Prior to Admission medications   Medication Sig Start Date End Date Taking? Authorizing Provider  B-D 3CC LUER-LOK SYR 25GX1/2" 25G X 1-1/2" 3 ML MISC INJECT 1 ML Q OTHER WEEK INTO MUSCLE UTD 10/21/17   [provider]  BD DISP NEEDLES 22G X 1-1/2" MISC USE UTD 10/21/17   [provider]  cyclobenzaprine (FLEXERIL) 10 MG tablet Take 1 tablet (10 mg total) by mouth 2 (two) times daily as needed for muscle spasms. Patient not taking: Reported on 09/12/2018 08/18/18   Fayrene Helper, PA-C  Fish Oil-Cholecalciferol (FISH OIL + D3 PO) Take 1 tablet by mouth daily.     [provider]  FLUoxetine HCl 60 MG TABS Take 30 mg by mouth daily. Take half tab once a day 07/11/18   Nche,  Charlene Brooke, NP  gabapentin (NEURONTIN) 100 MG capsule Take 1cap AM & noon and 2caps at hs continous 09/12/18   Nche, Charlene Brooke, NP  ibuprofen (ADVIL) 600 MG tablet Take 1 tablet (600 mg total) by mouth every 6 (six) hours as needed. 08/18/18   Domenic Moras, PA-C  Testosterone Cypionate 200 MG/ML SOLN Inject 100 mg into the muscle every 14 (fourteen) days.    [provider]  Turmeric 500 MG CAPS Take 500 mg by mouth daily.    [provider]    Allergies    Ginger and Pyridium [phenazopyridine hcl]  Review of Systems   Review of Systems  All other systems reviewed and are negative.   Physical Exam Updated Vital Signs BP 126/76 (BP Location: Right Arm)   Pulse 99   Temp 98 F (36.7 C) (Oral)   Resp 16   SpO2 99%   Physical Exam Vitals and nursing note reviewed.  Constitutional:       General: He is not in acute distress.    Appearance: Normal appearance. He is well-developed. He is not toxic-appearing.  HENT:     Head: Normocephalic and atraumatic.  Eyes:     General: Lids are normal.     Conjunctiva/sclera: Conjunctivae normal.     Pupils: Pupils are equal, round, and reactive to light.  Neck:     Thyroid: No thyroid mass.     Trachea: No tracheal deviation.  Cardiovascular:     Rate and Rhythm: Normal rate and regular rhythm.     Heart sounds: Normal heart sounds. No murmur. No gallop.   Pulmonary:     Effort: Pulmonary effort is normal. No respiratory distress.     Breath sounds: Normal breath sounds. No stridor. No decreased breath sounds, wheezing, rhonchi or rales.  Abdominal:     General: Bowel sounds are normal. There is no distension.     Palpations: Abdomen is soft.     Tenderness: There is no abdominal tenderness. There is no rebound.    Musculoskeletal:        General: No tenderness. Normal range of motion.     Cervical back: Normal range of motion and neck supple.     Comments: No cva tenderness  Skin:    General: Skin is warm and dry.     Findings: No abrasion or rash.  Neurological:     Mental Status: He is alert and oriented to person, place, and time.     GCS: GCS eye subscore is 4. GCS verbal subscore is 5. GCS motor subscore is 6.     Cranial Nerves: No cranial nerve deficit.     Sensory: No sensory deficit.  Psychiatric:        Speech: Speech normal.        Behavior: Behavior normal.     ED Results / Procedures / Treatments   Labs (all labs ordered are listed, but only abnormal results are displayed) Labs Reviewed  URINALYSIS, ROUTINE W REFLEX MICROSCOPIC    EKG None  Radiology No results found.  Procedures Procedures (including critical care time)  Medications Ordered in ED Medications - No data to display  ED Course  I have reviewed the triage vital signs and the nursing notes.  Pertinent labs & imaging results  that were available during my care of the patient were reviewed by me and considered in my medical decision making (see chart for details).    MDM Rules/Calculators/A&P  Urinalysis positive for infection.  Abdominal CT negative for ureteral stone.  Will place on antibiotics and discharged home Final Clinical Impression(s) / ED Diagnoses Final diagnoses:  None    Rx / DC Orders ED Discharge Orders    None       Lorre Nick, MD 03/25/19 1623    Lorre Nick, MD 04/02/19 1250

## 2019-06-23 ENCOUNTER — Emergency Department (HOSPITAL_COMMUNITY)
Admission: EM | Admit: 2019-06-23 | Discharge: 2019-06-23 | Disposition: A | Discharge status: Home / Self Care | Payer: BC Managed Care – PPO | Attending: Emergency Medicine | Admitting: Emergency Medicine

## 2019-06-23 ENCOUNTER — Other Ambulatory Visit: Payer: Self-pay

## 2019-06-23 DIAGNOSIS — F1113 Opioid abuse with withdrawal: Secondary | ICD-10-CM | POA: Insufficient documentation

## 2019-06-23 DIAGNOSIS — F1193 Opioid use, unspecified with withdrawal: Secondary | ICD-10-CM

## 2019-06-23 DIAGNOSIS — G8929 Other chronic pain: Secondary | ICD-10-CM | POA: Insufficient documentation

## 2019-06-23 DIAGNOSIS — R000 Tachycardia, unspecified: Secondary | ICD-10-CM | POA: Insufficient documentation

## 2019-06-23 DIAGNOSIS — R197 Diarrhea, unspecified: Secondary | ICD-10-CM | POA: Insufficient documentation

## 2019-06-23 DIAGNOSIS — F1123 Opioid dependence with withdrawal: Secondary | ICD-10-CM

## 2019-06-23 MED ORDER — ONDANSETRON 4 MG PO TBDP: 4.0000 mg | ORAL_TABLET | Freq: Three times a day (TID) | ORAL | 0 refills | Status: DC | PRN

## 2019-06-23 MED ORDER — LOPERAMIDE HCL 2 MG PO CAPS: 2.0000 mg | ORAL_CAPSULE | Freq: Four times a day (QID) | ORAL | 0 refills | Status: AC | PRN

## 2019-06-23 MED ORDER — CYCLOBENZAPRINE HCL 10 MG PO TABS: 10.0000 mg | ORAL_TABLET | Freq: Two times a day (BID) | ORAL | 0 refills | Status: AC | PRN

## 2019-06-23 NOTE — ED Triage Notes (Signed)
Pt sts she ran out of pain meds for chronic pain

## 2019-06-23 NOTE — ED Provider Notes (Signed)
[facility]-EMERGENCY DEPT Provider Note   CSN: [id number] Arrival date & time: 06/23/19  0146     History Chief Complaint  Patient presents with  . Medication Refill    Shelia Walker is a 24 y.o. adult.  Patient presents to the emergency department with a chief complaint of medication refill.  They state that they ran out of their chronic pain medication.  Patient reports that their doctor does not refill pain medication over the weekend.  Patient reports having had diarrhea, but denies any vomiting.  They deny any other associated symptoms apart from pain.  Rates pain as a 10/10.  The history is provided by the patient. No language interpreter was used.       Past Medical History:  Diagnosis Date  . Anxiety   . Depression   . Migraines   . PTSD (post-traumatic stress disorder)   . Seizures (HCC)   . Transgender     Patient Active Problem List   Diagnosis Date Noted  . Acute pain of right knee 06/06/2018  . Vitamin D deficiency 05/04/2018  . Positive ANA (antinuclear antibody) 11/07/2017  . 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 Severe episode of recurrent major depressive disorder, without psychotic features (HCC) 11/04/2017  . Myalgia 11/04/2017    Past Surgical History:  Procedure Laterality Date  . WISDOM TOOTH EXTRACTION       OB History   No obstetric history on file.     Family History  Problem Relation Age of Onset  . Fibromyalgia Mother   . Cerebral aneurysm Mother   . Hyperlipidemia Mother   . Hypertension Mother   . Stroke Mother        x3, first stroke age 58, third stroke 2013  . Depression Mother   . Autoimmune disease Mother   . Depression Father   . Fibromyalgia Maternal Grandmother   . Arthritis Maternal Grandmother   . Diabetes Maternal Grandmother   . Depression Maternal Grandmother   . Depression Sister   . Learning disabilities Sister   . Mental illness Sister   . Healthy Brother     Social  History   Tobacco Use  . Smoking status: Former Smoker    Packs/day: 0.10    Years: 5.00    Pack years: 0.50    Types: Cigarettes    Quit date: 05/2017    Years since quitting: 2.0  . Smokeless tobacco: Never Used  . Tobacco comment: 3 cigs daily  Substance Use Topics  . Alcohol use: Yes    Comment: social  . Drug use: Yes    Types: Marijuana    Comment: daily    Home Medications Prior to Admission medications   Medication Sig Start Date End Date Taking? Authorizing Provider  B-D 3CC LUER-LOK SYR 25GX1/2" 25G X 1-1/2" 3 ML MISC INJECT 1 ML Q OTHER WEEK INTO MUSCLE UTD 10/21/17   [provider]  BD DISP NEEDLES 22G X 1-1/2" MISC USE UTD 10/21/17   [provider]  cephALEXin (KEFLEX) 500 MG capsule Take 1 capsule (500 mg total) by mouth 4 (four) times daily. 03/25/19   Lorre Nick, MD  CRANBERRY PO Take 1 capsule by mouth daily.    [provider]  cyclobenzaprine (FLEXERIL) 10 MG tablet Take 1 tablet (10 mg total) by mouth 2 (two) times daily as needed for muscle spasms. 06/23/19   Roxy Horseman, PA-C  FLUoxetine HCl 60 MG TABS Take 30 mg by mouth daily. Take half  tab once a day Patient not taking: Reported on 03/25/2019 07/11/18   Nche, Bonna Gains, NP  gabapentin (NEURONTIN) 100 MG capsule Take 1cap AM & noon and 2caps at hs continous Patient not taking: Reported on 03/25/2019 09/12/18   Nche, Bonna Gains, NP  ibuprofen (ADVIL) 600 MG tablet Take 1 tablet (600 mg total) by mouth every 6 (six) hours as needed. Patient not taking: Reported on 03/25/2019 08/18/18   Fayrene Helper, PA-C  loperamide (IMODIUM) 2 MG capsule Take 1 capsule (2 mg total) by mouth 4 (four) times daily as needed for diarrhea or loose stools. 06/23/19   Roxy Horseman, PA-C  meloxicam (MOBIC) 15 MG tablet Take 15 mg by mouth daily. 03/14/19   [provider]  ondansetron (ZOFRAN ODT) 4 MG disintegrating tablet Take 1 tablet (4 mg total) by mouth every 8 (eight) hours as  needed. 06/23/19   Roxy Horseman, PA-C  oxyCODONE-acetaminophen (PERCOCET) 7.5-325 MG tablet Take 1 tablet by mouth 3 (three) times daily as needed for moderate pain or severe pain.  03/23/19   [provider]  prazosin (MINIPRESS) 1 MG capsule Take 1 mg by mouth at bedtime. 01/15/19   [provider]  sertraline (ZOLOFT) 25 MG tablet Take 25 mg by mouth daily. 01/15/19   [provider]  Testosterone Cypionate 200 MG/ML SOLN Inject 100 mg into the muscle every 14 (fourteen) days.    [provider]    Allergies    Ginger and Pyridium [phenazopyridine hcl]  Review of Systems   Review of Systems  All other systems reviewed and are negative.   Physical Exam Updated Vital Signs BP (!) 128/94 (BP Location: Right Arm)   Pulse (!) 122   Temp 98.1 F (36.7 C) (Oral)   Resp 18   SpO2 99%   Physical Exam Vitals and nursing note reviewed.  Constitutional:      General: He is not in acute distress.    Appearance: He is well-developed. He is not ill-appearing.  HENT:     Head: Normocephalic and atraumatic.  Eyes:     Conjunctiva/sclera: Conjunctivae normal.  Cardiovascular:     Rate and Rhythm: Normal rate and regular rhythm.  Pulmonary:     Effort: Pulmonary effort is normal. No respiratory distress.     Breath sounds: Normal breath sounds.  Abdominal:     General: There is no distension.  Musculoskeletal:        General: Normal range of motion.     Cervical back: Neck supple.     Comments: Moves all extremities  Skin:    General: Skin is warm and dry.  Neurological:     Mental Status: He is alert and oriented to person, place, and time.  Psychiatric:        Mood and Affect: Mood normal.        Behavior: Behavior normal.     ED Results / Procedures / Treatments   Labs (all labs ordered are listed, but only abnormal results are displayed) Labs Reviewed - No data to display  EKG None  Radiology No results  found.  Procedures Procedures (including critical care time)  Medications Ordered in ED Medications - No data to display  ED Course  I have reviewed the triage vital signs and the nursing notes.  Pertinent labs & imaging results that were available during my care of the patient were reviewed by me and considered in my medical decision making (see chart for details).    MDM  Rules/Calculators/A&P                      Patient here requesting refill of chronic pain medication.  Patient states that they have been out of their pain medication.  They have had some withdrawal symptoms including tachycardia and diarrhea.  I told the patient that I would be unable to refill the opiate pain medication, but that I could help with the withdrawal symptoms.  Will prescribe some muscle relaxer, antiemetic, and Imodium for diarrhea.  Patient will see their doctor on Monday.  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

## 2019-08-21 IMAGING — CR CHEST - 2 VIEW
2 series · 2 of 2 positions shown · non-contrast
Comparison: None.

CLINICAL DATA: Shortness of breath beginning yesterday and
worsening today.

EXAM:
CHEST - 2 VIEW

[w chest pa]
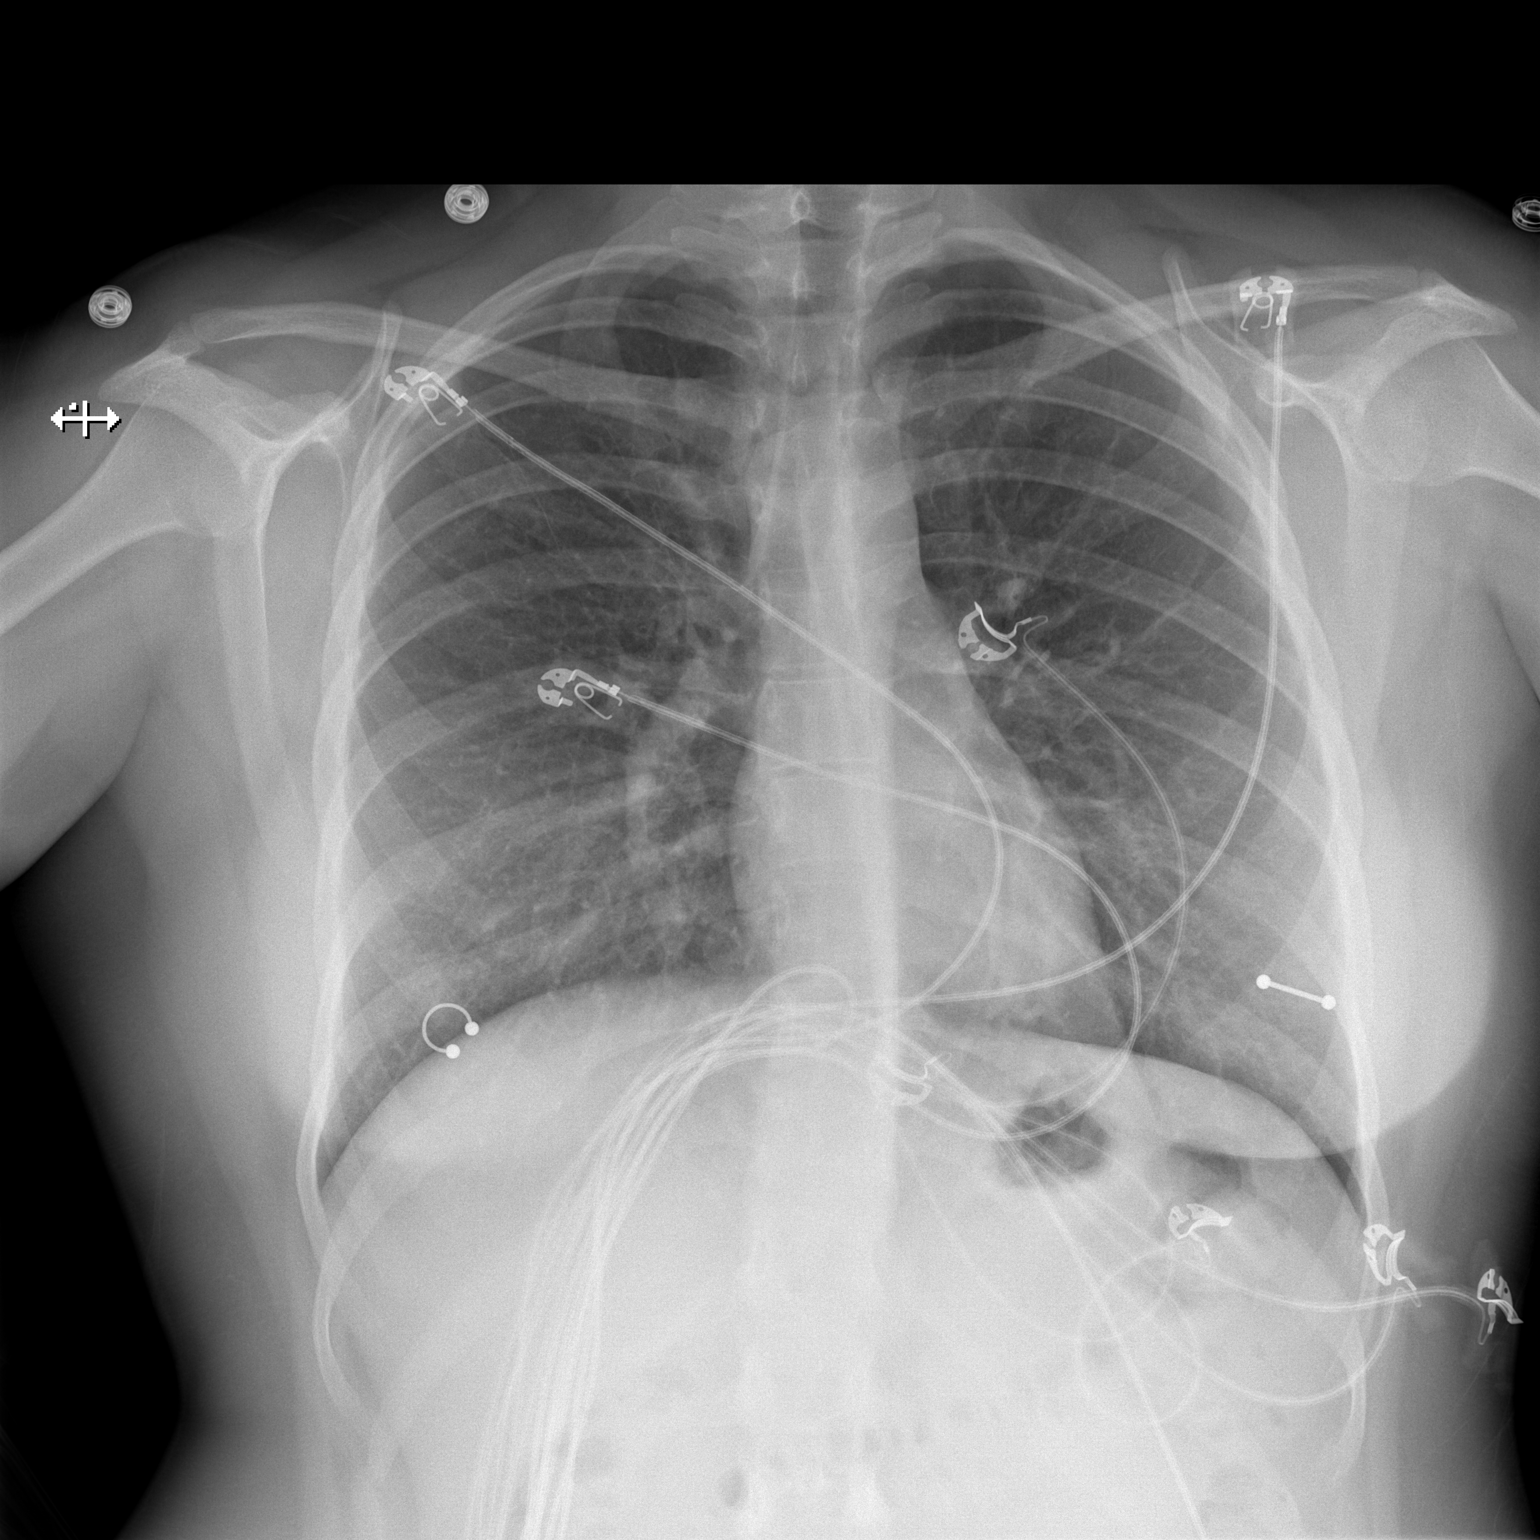

[w chest lat]
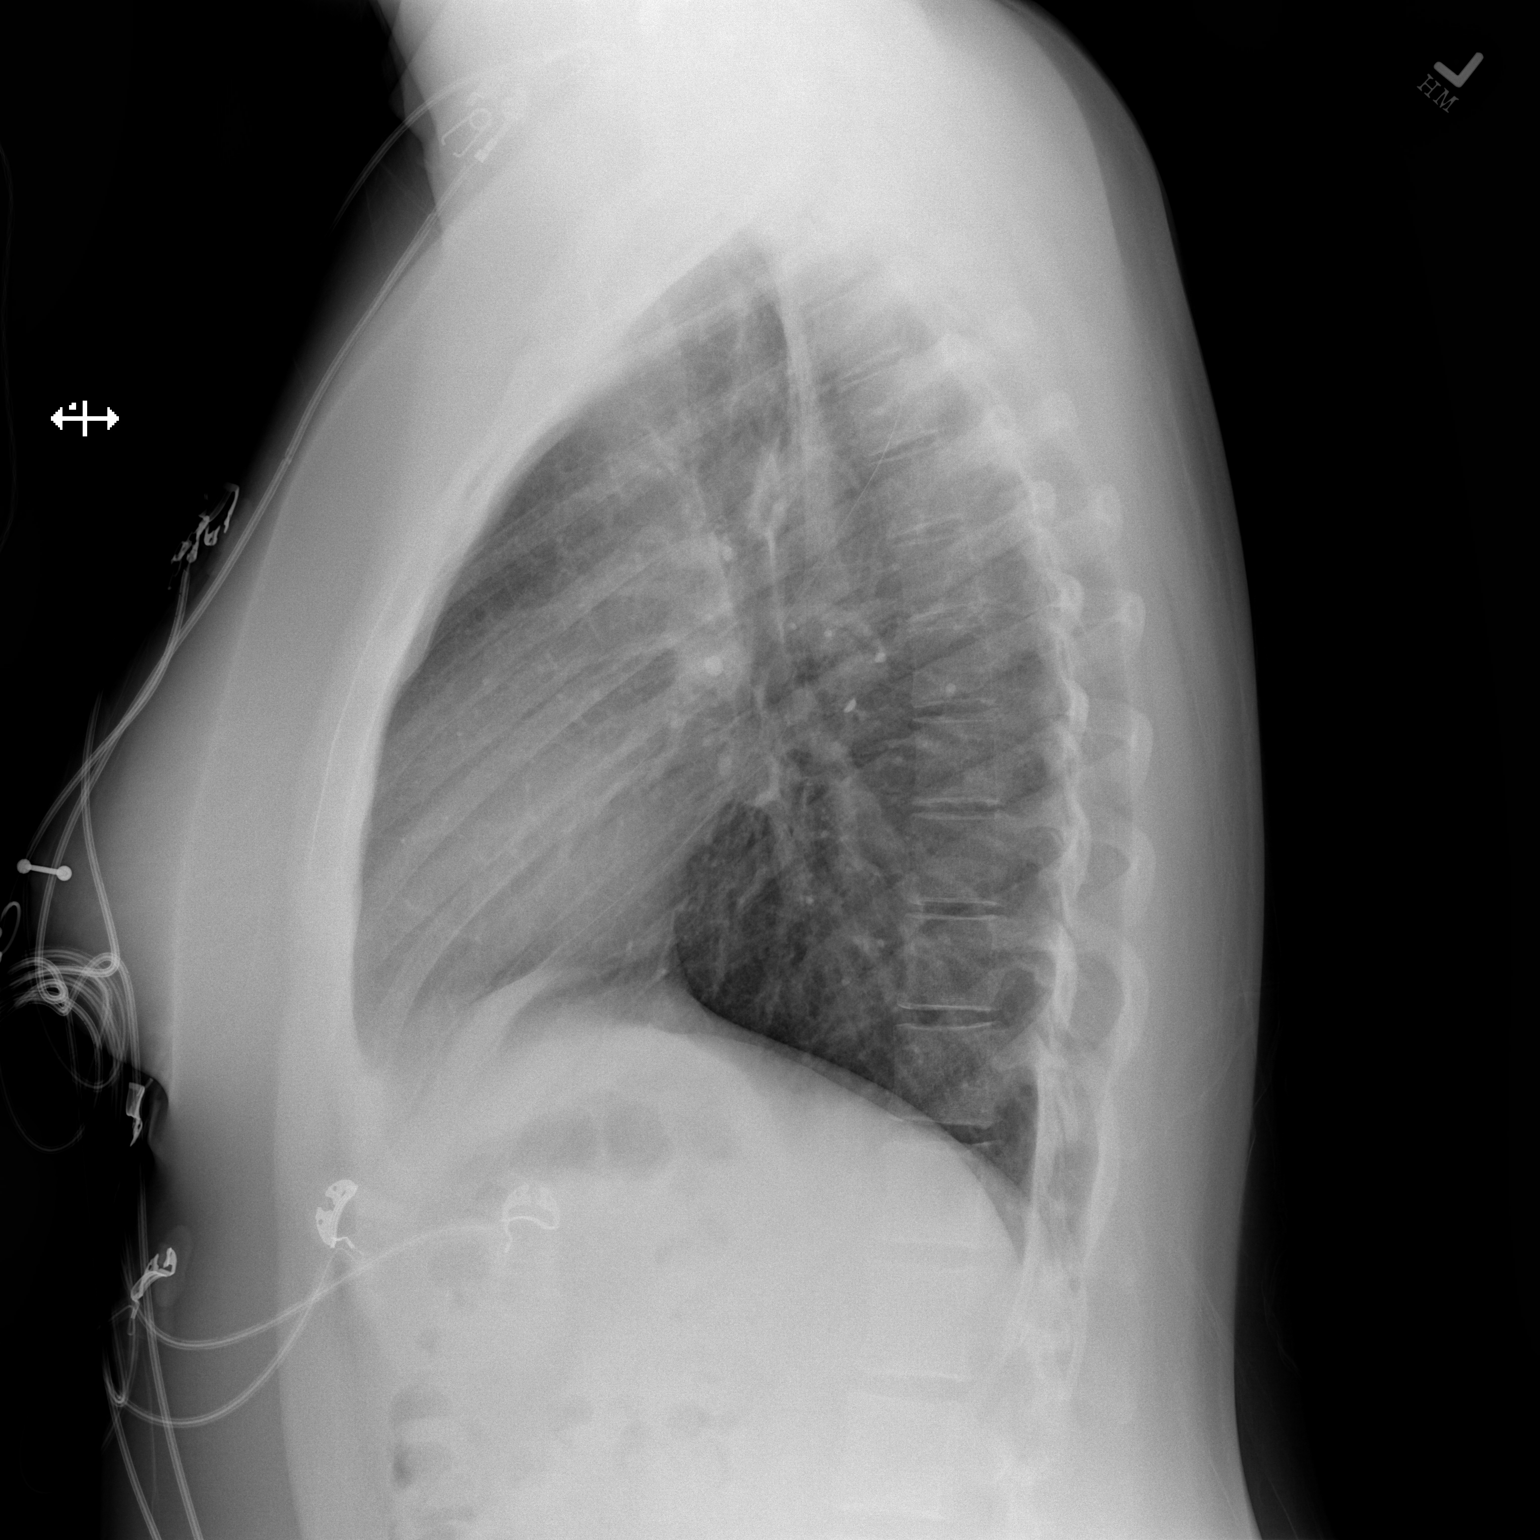

[2 of 2 positions shown; findings below may reference images not displayed]

FINDINGS: There is overlying artifact. Heart size is normal. Mediastinal
shadows are normal. The lungs are clear. No bronchial thickening. No
infiltrate, mass, effusion or collapse. Pulmonary vascularity is
normal. No bony abnormality.
IMPRESSION: Allowing for overlying artifact, normal chest

## 2019-09-10 IMAGING — US US RENAL
1 series · 14 of 16 positions shown · non-contrast
Comparison: 07/24/2018.

CLINICAL DATA: Right flank pain.  History of nephrolithiasis.

EXAM:
RENAL / URINARY TRACT ULTRASOUND COMPLETE

[Series 1: us renal · 14 of 16 slices shown]
[im 1/16]
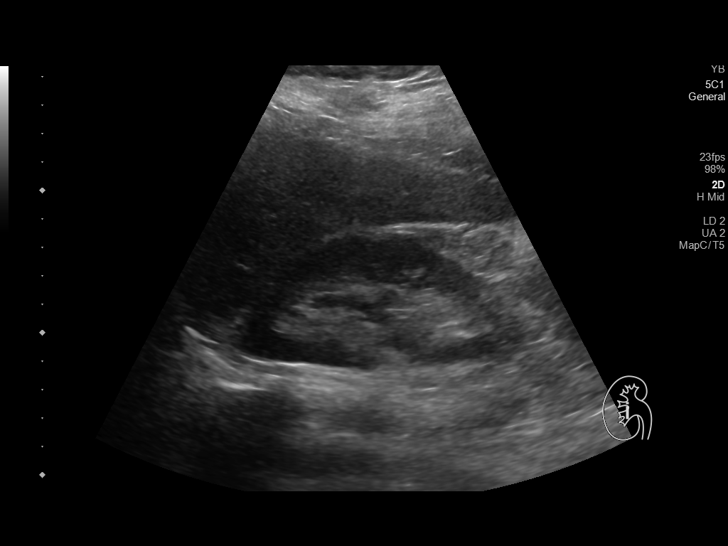
[im 2/16]
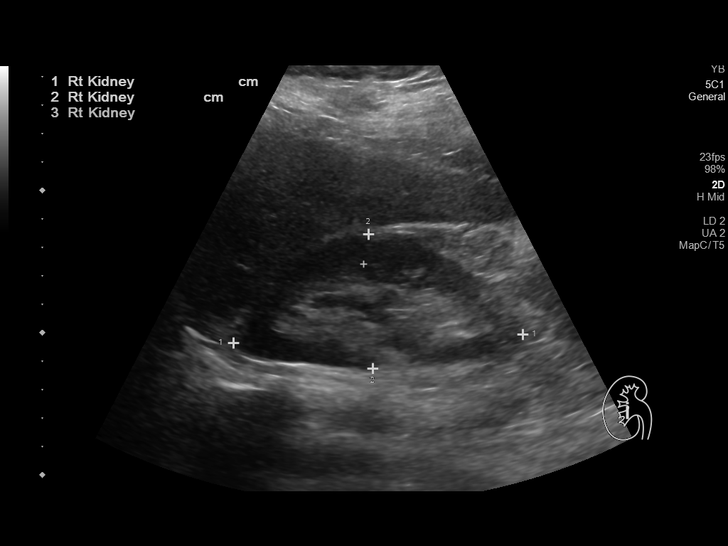
[im 3/16]
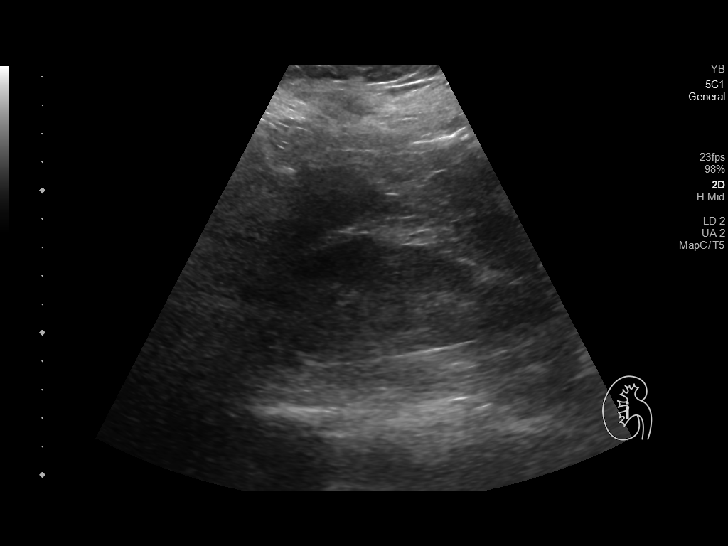
[im 5/16]
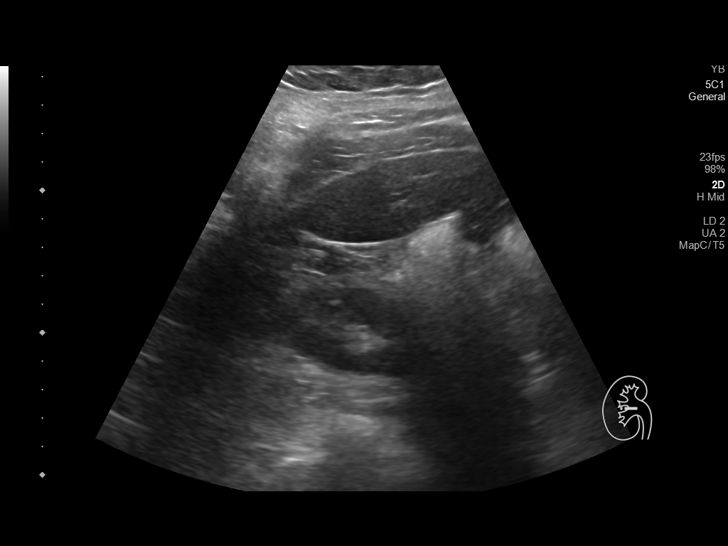
[im 6/16]
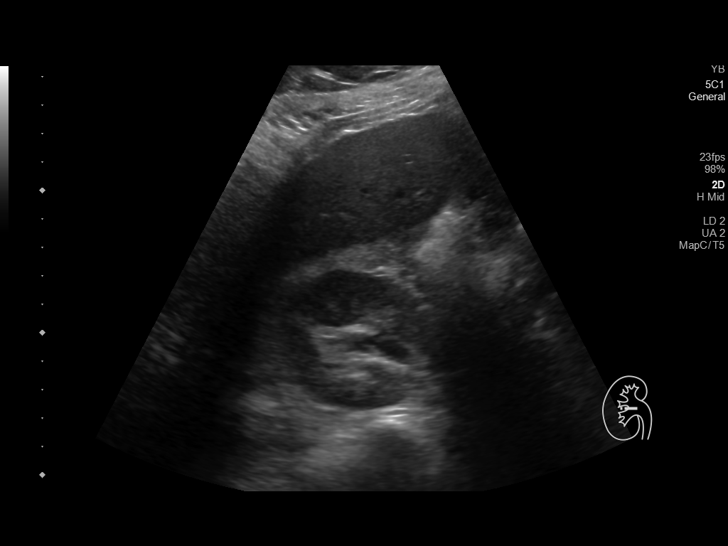
[im 7/16]
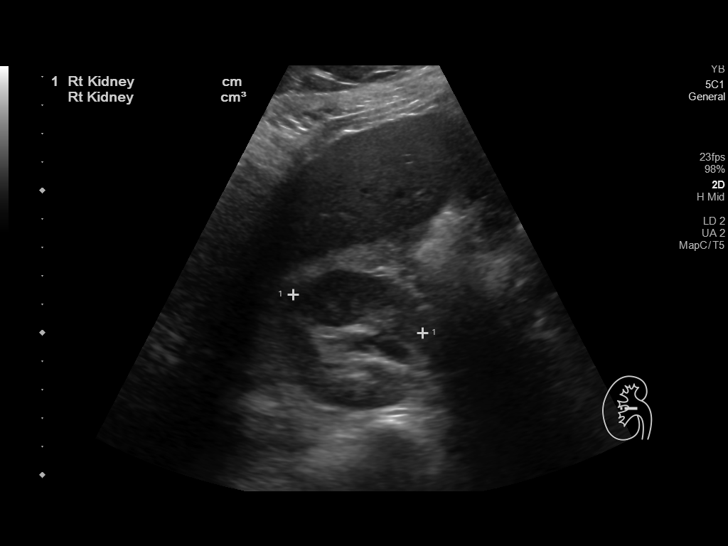
[im 8/16]
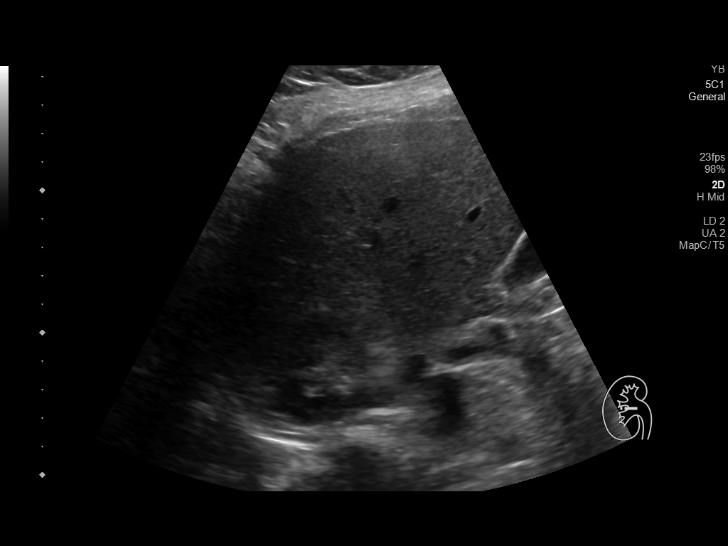
[im 9/16]
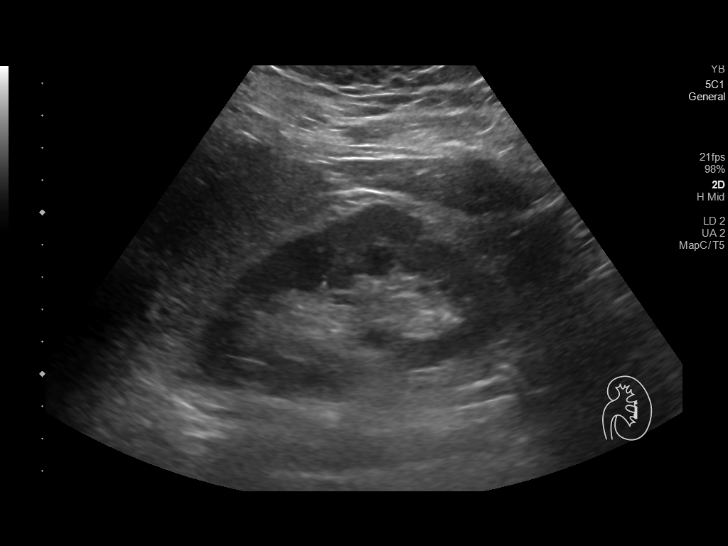
[im 10/16]
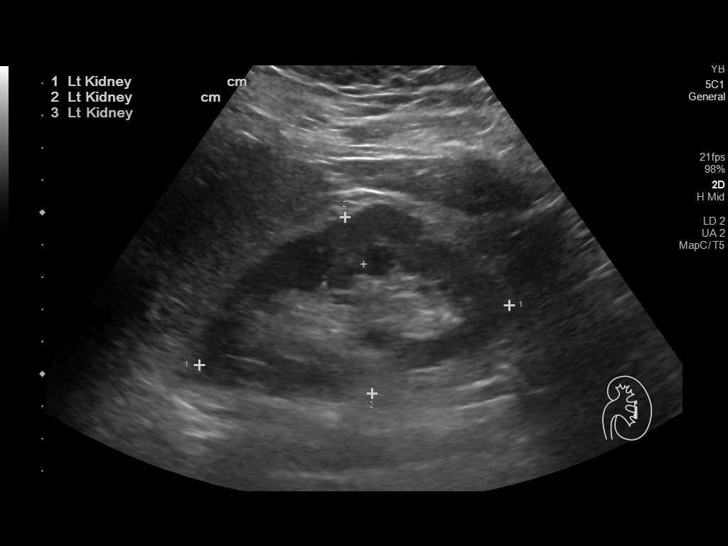
[im 11/16]
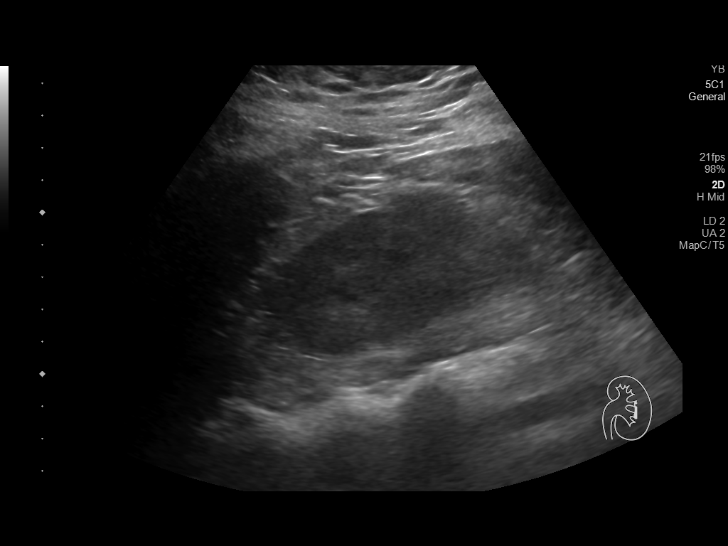
[im 13/16]
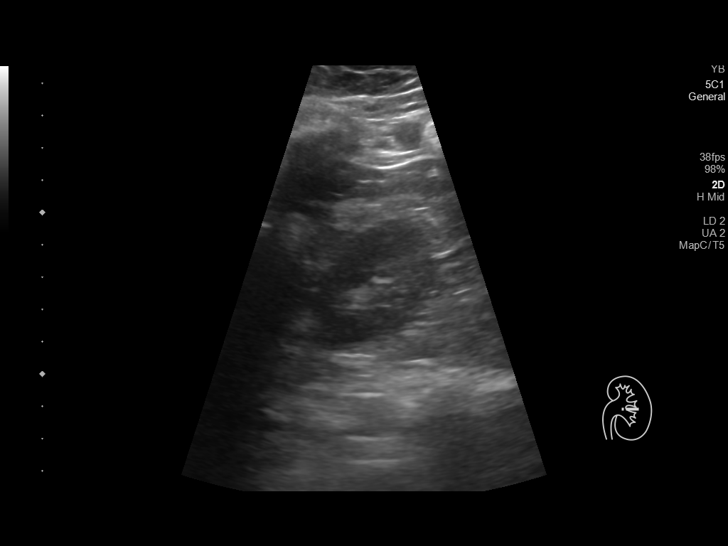
[im 14/16]
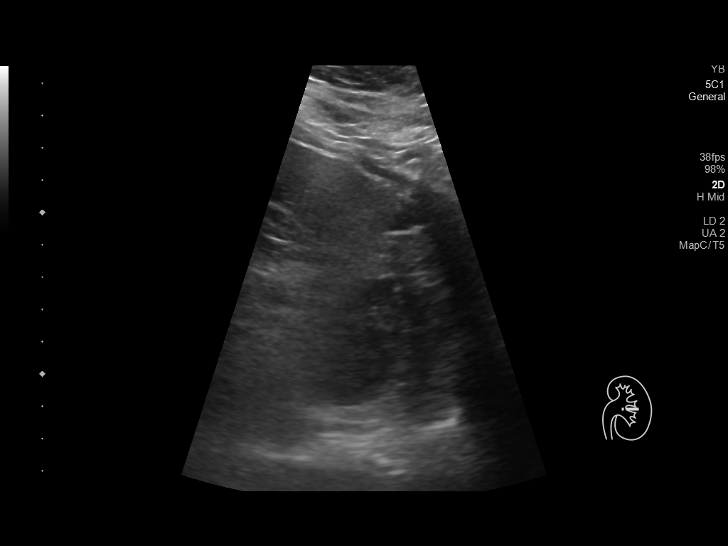
[im 15/16]
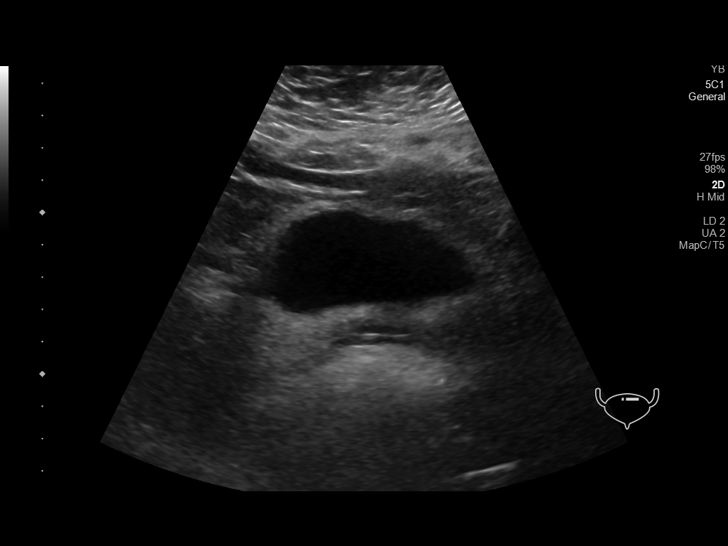
[im 16/16]
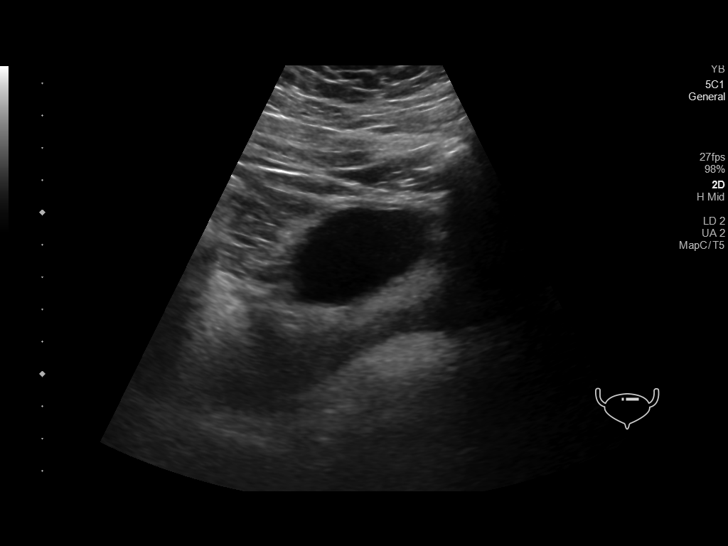

[14 of 16 positions shown; findings below may reference images not displayed]

FINDINGS: Right Kidney:

Renal measurements: 10.2 x 4.7 x 4.7 cm = volume: 119 mL .
Echogenicity within normal limits. No mass or hydronephrosis
visualized.

Left Kidney:

Renal measurements: 9.8 x 5.5 x 4.0 cm = volume: 113 mL.
Echogenicity within normal limits. No mass or hydronephrosis
visualized.

Bladder:

Appears normal for degree of bladder distention.
IMPRESSION: Normal examination.

## 2020-01-10 ENCOUNTER — Emergency Department (HOSPITAL_COMMUNITY)
Admission: EM | Admit: 2020-01-10 | Discharge: 2020-01-10 | Disposition: A | Discharge status: Home / Self Care | Payer: Self-pay | Attending: Emergency Medicine | Admitting: Emergency Medicine

## 2020-01-10 ENCOUNTER — Encounter (HOSPITAL_COMMUNITY): Payer: Self-pay | Admitting: Emergency Medicine

## 2020-01-10 ENCOUNTER — Emergency Department (HOSPITAL_COMMUNITY): Payer: Self-pay

## 2020-01-10 DIAGNOSIS — Z79899 Other long term (current) drug therapy: Secondary | ICD-10-CM | POA: Insufficient documentation

## 2020-01-10 DIAGNOSIS — Z87891 Personal history of nicotine dependence: Secondary | ICD-10-CM | POA: Insufficient documentation

## 2020-01-10 DIAGNOSIS — N200 Calculus of kidney: Secondary | ICD-10-CM | POA: Insufficient documentation

## 2020-01-10 LAB — URINALYSIS, ROUTINE W REFLEX MICROSCOPIC
Bacteria, UA: NONE SEEN
Bilirubin Urine: NEGATIVE
Glucose, UA: NEGATIVE mg/dL
Ketones, ur: 20 mg/dL — AB
Nitrite: NEGATIVE
Protein, ur: NEGATIVE mg/dL
Specific Gravity, Urine: 1.017 (ref 1.005–1.030)
pH: 7 (ref 5.0–8.0)

## 2020-01-10 LAB — CBC
HCT: 40.3 % (ref 36.0–46.0)
Hemoglobin: 13.3 g/dL (ref 12.0–15.0)
MCH: 29 pg (ref 26.0–34.0)
MCHC: 33 g/dL (ref 30.0–36.0)
MCV: 88 fL (ref 80.0–100.0)
Platelets: 346 10*3/uL (ref 150–400)
RBC: 4.58 MIL/uL (ref 3.87–5.11)
RDW: 12.4 % (ref 11.5–15.5)
WBC: 16.4 10*3/uL — ABNORMAL HIGH (ref 4.0–10.5)
nRBC: 0 % (ref 0.0–0.2)

## 2020-01-10 LAB — I-STAT BETA HCG BLOOD, ED (MC, WL, AP ONLY): I-stat hCG, quantitative: <5 m[IU]/mL (ref <5)

## 2020-01-10 LAB — BASIC METABOLIC PANEL
Anion gap: 13 (ref 5–15)
BUN: 9 mg/dL (ref 6–20)
CO2: 22 mmol/L (ref 22–32)
Calcium: 9.6 mg/dL (ref 8.9–10.3)
Chloride: 102 mmol/L (ref 98–111)
Creatinine, Ser: 0.98 mg/dL (ref 0.44–1.00)
GFR, Estimated: >60 mL/min (ref >60)
Glucose, Bld: 123 mg/dL — ABNORMAL HIGH (ref 70–99)
Potassium: 3.5 mmol/L (ref 3.5–5.1)
Sodium: 137 mmol/L (ref 135–145)

## 2020-01-10 MED ORDER — SODIUM CHLORIDE 0.9 % IV BOLUS
1000.0000 mL | Freq: Once | INTRAVENOUS | Status: AC
  Administered 2020-01-10: 1000 mL via INTRAVENOUS

## 2020-01-10 MED ORDER — ONDANSETRON HCL 4 MG/2ML IJ SOLN
4.0000 mg | Freq: Once | INTRAMUSCULAR | Status: AC
  Administered 2020-01-10: 4 mg via INTRAVENOUS
  Filled 2020-01-10: qty 2

## 2020-01-10 MED ORDER — KETOROLAC TROMETHAMINE 60 MG/2ML IM SOLN
60.0000 mg | Freq: Once | INTRAMUSCULAR | Status: AC
  Administered 2020-01-10: 60 mg via INTRAMUSCULAR
  Filled 2020-01-10: qty 2

## 2020-01-10 MED ORDER — ONDANSETRON HCL 4 MG PO TABS: 4.0000 mg | ORAL_TABLET | Freq: Three times a day (TID) | ORAL | 0 refills | Status: AC | PRN

## 2020-01-10 MED ORDER — MORPHINE SULFATE (PF) 4 MG/ML IV SOLN
4.0000 mg | Freq: Once | INTRAVENOUS | Status: AC
  Administered 2020-01-10: 4 mg via INTRAVENOUS
  Filled 2020-01-10: qty 1

## 2020-01-10 MED ORDER — CIPROFLOXACIN HCL 500 MG PO TABS: 500.0000 mg | ORAL_TABLET | Freq: Two times a day (BID) | ORAL | 0 refills | Status: AC

## 2020-01-10 MED ORDER — SODIUM CHLORIDE 0.9 % IV SOLN
1.0000 g | Freq: Once | INTRAVENOUS | Status: AC
  Administered 2020-01-10: 1 g via INTRAVENOUS
  Filled 2020-01-10: qty 10

## 2020-01-10 MED ORDER — TAMSULOSIN HCL 0.4 MG PO CAPS: 0.4000 mg | ORAL_CAPSULE | Freq: Every day | ORAL | 0 refills | Status: AC

## 2020-01-10 MED ORDER — MORPHINE SULFATE (PF) 2 MG/ML IV SOLN
2.0000 mg | Freq: Once | INTRAVENOUS | Status: AC
  Administered 2020-01-10: 2 mg via INTRAVENOUS
  Filled 2020-01-10: qty 1

## 2020-01-10 NOTE — ED Provider Notes (Signed)
[facility] Provider Note   CSN: [id number] Arrival date & time: 01/10/20  1004     History Chief Complaint  Patient presents with  . Flank Pain    Shelia Walker is a 24 y.o. adult.  HPI   Patient is a 24 year old transgender female transitioning to female who goes by the name Shelia Walker.  The patient states they started experiencing right flank pain about 1 week ago.  This lasted for 2 days with associated nausea and vomiting when the pain worsened.  It spontaneously resolved and then started about 3 days ago once again.  They report persistent nausea and vomiting with little p.o. intake.  Reports a history of frequent UTIs as well as kidney stones.  Patient was previously on testosterone therapy but states that they discontinued this about 3 months ago.  Reports mild vaginal discharge that is chronic and denies this has acutely worsened.  They are sexually active with female and female partners but deny any vaginal intercourse.  No fevers, chills, chest pain, shortness of breath, dysuria, hematuria, leg swelling, calf pain.     Past Medical History:  Diagnosis Date  . Anxiety   . Depression   . Migraines   . PTSD (post-traumatic stress disorder)   . Seizures (HCC)   . Transgender     Patient Active Problem List   Diagnosis Date Noted  . Acute pain of right knee 06/06/2018  . Vitamin D deficiency 05/04/2018  . Positive ANA (antinuclear antibody) 11/07/2017  . 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 Severe episode of recurrent major depressive disorder, without psychotic features (HCC) 11/04/2017  . Myalgia 11/04/2017    Past Surgical History:  Procedure Laterality Date  . WISDOM TOOTH EXTRACTION       OB History   No obstetric history on file.     Family History  Problem Relation Age of Onset  . Fibromyalgia Mother   . Cerebral aneurysm Mother   . Hyperlipidemia Mother   . Hypertension Mother   . Stroke Mother         x3, first stroke age 23, third stroke 2013  . Depression Mother   . Autoimmune disease Mother   . Depression Father   . Fibromyalgia Maternal Grandmother   . Arthritis Maternal Grandmother   . Diabetes Maternal Grandmother   . Depression Maternal Grandmother   . Depression Sister   . Learning disabilities Sister   . Mental illness Sister   . Healthy Brother     Social History   Tobacco Use  . Smoking status: Former Smoker    Packs/day: 0.10    Years: 5.00    Pack years: 0.50    Types: Cigarettes    Quit date: 05/2017    Years since quitting: 2.6  . Smokeless tobacco: Never Used  . Tobacco comment: 3 cigs daily  Vaping Use  . Vaping Use: Never used  Substance Use Topics  . Alcohol use: Yes    Comment: social  . Drug use: Yes    Types: Marijuana    Comment: daily    Home Medications Prior to Admission medications   Medication Sig Start Date End Date Taking? Authorizing Provider  B-D 3CC LUER-LOK SYR 25GX1/2" 25G X 1-1/2" 3 ML MISC INJECT 1 ML Q OTHER WEEK INTO MUSCLE UTD 10/21/17   [provider]  BD DISP NEEDLES 22G X 1-1/2" MISC USE UTD 10/21/17   [provider]  cephALEXin (KEFLEX) 500 MG capsule Take 1  capsule (500 mg total) by mouth 4 (four) times daily. 03/25/19   Lorre Nick, MD  CRANBERRY PO Take 1 capsule by mouth daily.    [provider]  cyclobenzaprine (FLEXERIL) 10 MG tablet Take 1 tablet (10 mg total) by mouth 2 (two) times daily as needed for muscle spasms. 06/23/19   Roxy Horseman, PA-C  FLUoxetine HCl 60 MG TABS Take 30 mg by mouth daily. Take half tab once a day Patient not taking: Reported on 03/25/2019 07/11/18   Nche, Bonna Gains, NP  gabapentin (NEURONTIN) 100 MG capsule Take 1cap AM & noon and 2caps at hs continous Patient not taking: Reported on 03/25/2019 09/12/18   Nche, Bonna Gains, NP  ibuprofen (ADVIL) 600 MG tablet Take 1 tablet (600 mg total) by mouth every 6 (six) hours as needed. Patient not taking:  Reported on 03/25/2019 08/18/18   Fayrene Helper, PA-C  loperamide (IMODIUM) 2 MG capsule Take 1 capsule (2 mg total) by mouth 4 (four) times daily as needed for diarrhea or loose stools. 06/23/19   Roxy Horseman, PA-C  meloxicam (MOBIC) 15 MG tablet Take 15 mg by mouth daily. 03/14/19   [provider]  ondansetron (ZOFRAN ODT) 4 MG disintegrating tablet Take 1 tablet (4 mg total) by mouth every 8 (eight) hours as needed. 06/23/19   Roxy Horseman, PA-C  oxyCODONE-acetaminophen (PERCOCET) 7.5-325 MG tablet Take 1 tablet by mouth 3 (three) times daily as needed for moderate pain or severe pain.  03/23/19   [provider]  prazosin (MINIPRESS) 1 MG capsule Take 1 mg by mouth at bedtime. 01/15/19   [provider]  sertraline (ZOLOFT) 25 MG tablet Take 25 mg by mouth daily. 01/15/19   [provider]  Testosterone Cypionate 200 MG/ML SOLN Inject 100 mg into the muscle every 14 (fourteen) days.    [provider]    Allergies    Ginger and Pyridium [phenazopyridine hcl]  Review of Systems   Review of Systems  All other systems reviewed and are negative. Ten systems reviewed and are negative for acute change, except as noted in the HPI.   Physical Exam Updated Vital Signs BP 114/90 (BP Location: Right Arm)   Pulse 92   Temp 98.9 F (37.2 C) (Oral)   Resp 20   Ht 5\' 3"  (1.6 m)   Wt 61.7 kg   SpO2 100%   BMI 24.09 kg/m   Physical Exam Vitals and nursing note reviewed.  Constitutional:      General: He is not in acute distress.    Appearance: Normal appearance. He is not ill-appearing, toxic-appearing or diaphoretic.  HENT:     Head: Normocephalic and atraumatic.     Right Ear: External ear normal.     Left Ear: External ear normal.     Nose: Nose normal.     Mouth/Throat:     Mouth: Mucous membranes are moist.     Pharynx: Oropharynx is clear. No oropharyngeal exudate or posterior oropharyngeal erythema.  Eyes:     Extraocular Movements:  Extraocular movements intact.  Cardiovascular:     Rate and Rhythm: Normal rate and regular rhythm.     Pulses: Normal pulses.     Heart sounds: Normal heart sounds. No murmur heard.  No friction rub. No gallop.   Pulmonary:     Effort: Pulmonary effort is normal. No respiratory distress.     Breath sounds: Normal breath sounds. No stridor. No wheezing, rhonchi or rales.  Abdominal:  General: Abdomen is flat.     Palpations: Abdomen is soft.     Tenderness: There is abdominal tenderness. There is right CVA tenderness. There is no left CVA tenderness.     Comments: Abdomen is soft.  Very mild diffuse abdominal tenderness with deep palpation.  Moderate right central lateral abdominal tenderness.  Moderate right flank pain.  No midline spine pain.  No left flank pain.  Musculoskeletal:        General: Normal range of motion.     Cervical back: Normal range of motion and neck supple. No tenderness.  Skin:    General: Skin is warm and dry.  Neurological:     General: No focal deficit present.     Mental Status: He is alert and oriented to person, place, and time.  Psychiatric:        Mood and Affect: Mood normal.        Behavior: Behavior normal.    ED Results / Procedures / Treatments   Labs (all labs ordered are listed, but only abnormal results are displayed) Labs Reviewed  URINALYSIS, ROUTINE W REFLEX MICROSCOPIC - Abnormal; Notable for the following components:      Result Value   APPearance HAZY (*)    Hgb urine dipstick SMALL (*)    Ketones, ur 20 (*)    Leukocytes,Ua SMALL (*)    All other components within normal limits  BASIC METABOLIC PANEL - Abnormal; Notable for the following components:   Glucose, Bld 123 (*)    All other components within normal limits  CBC - Abnormal; Notable for the following components:   WBC 16.4 (*)    All other components within normal limits  URINE CULTURE  I-STAT BETA HCG BLOOD, ED ([facility], [facility], [facility] ONLY)   EKG None  Radiology CT  Renal Stone Study  Result Date: 01/10/2020 CLINICAL DATA:  Acute onset of right flank pain several hours ago. Nephrolithiasis. EXAM: CT ABDOMEN AND PELVIS WITHOUT CONTRAST TECHNIQUE: Multidetector CT imaging of the abdomen and pelvis was performed following the standard protocol without IV contrast. COMPARISON:  03/25/2019 FINDINGS: Lower chest: No acute findings. Hepatobiliary: No mass visualized on this unenhanced exam. Gallbladder is unremarkable. No evidence of biliary ductal dilatation. Pancreas: No mass or inflammatory process visualized on this unenhanced exam. Spleen:  Within normal limits in size. Adrenals/Urinary tract: Mild right hydroureteronephrosis and perinephric stranding are seen. This is due to a calculus at right UVJ measuring 5 mm. Stomach/Bowel: No evidence of obstruction, inflammatory process, or abnormal fluid collections. Vascular/Lymphatic: No pathologically enlarged lymph nodes identified. No evidence of abdominal aortic aneurysm. Reproductive: IUD again seen in expected position. No mass or other significant abnormality. Other:  None. Musculoskeletal:  No suspicious bone lesions identified. IMPRESSION: Mild right hydroureteronephrosis and perinephric stranding due to 5 mm calculus at right UVJ. Electronically Signed   By: Danae Orleans M.D.   On: 01/10/2020 12:59    Procedures Procedures (including critical care time)  Medications Ordered in ED Medications  sodium chloride 0.9 % bolus 1,000 mL (0 mLs Intravenous Stopped 01/10/20 1436)  ondansetron (ZOFRAN) injection 4 mg (4 mg Intravenous Given 01/10/20 1343)  morphine 4 MG/ML injection 4 mg (4 mg Intravenous Given 01/10/20 1342)  cefTRIAXone (ROCEPHIN) 1 g in sodium chloride 0.9 % 100 mL IVPB (0 g Intravenous Stopped 01/10/20 1427)  ketorolac (TORADOL) injection 60 mg (60 mg Intramuscular Given 01/10/20 1352)  morphine 2 MG/ML injection 2 mg (2 mg Intravenous Given 01/10/20 1522)   ED  Course  I have reviewed the triage  vital signs and the nursing notes.  Pertinent labs & imaging results that were available during my care of the patient were reviewed by me and considered in my medical decision making (see chart for details).  Clinical Course as of Jan 09 1538  Thu Jan 10, 2020  1111 WBC(!): 16.4 [LJ]  1304 Mild right hydroureteronephrosis and perinephric stranding due to 5 mm calculus at right UVJ.  CT Renal [place] [LJ]  7106 Patient discussed with Dr. Benancio Deeds with urology.  Recommended a gram of Rocephin in the emergency department as well as 60 mg of IM Toradol.  Discharge on ciprofloxacin as well as tamsulosin.  Strict return precautions and outpatient follow-up in his office next week.   [LJ]    Clinical Course User Index [LJ] Placido Sou, PA-C   MDM Rules/Calculators/A&P                          Patient presents today due to right flank pain.  CT obtained showing mild right hydroureteronephrosis and perinephric stranding due to a 5 mm calculus at the right UVJ.  Initial labs show CBC with leukocytosis of 16.4.  UA shows small leukocytes, 11-20 white blood cells, mucus.  Patient's temperature mildly elevated at 99.1 Fahrenheit upon arrival.  Given these findings as well as patient's CT findings I discussed the patient with Dr. Benancio Deeds with urology.  Recommended I give patient a gram of Rocephin as well as IM Toradol.  Discharge patient on ciprofloxacin as well as tamsulosin.  Outpatient follow-up with Dr. Benancio Deeds next week.  Patient has completed a liter of IV fluids, 1 g of Rocephin, IV morphine, IV Zofran, IM Toradol.  She states her pain and nausea is significantly improved.  She feels comfortable being discharged at this time.  Recommended continued use of ibuprofen for pain.  Patient has a prescription for oxycodone at home.  Patient was given strict return precautions which we discussed in length.  Patient verbalized understanding the above plan.  Patient's questions were answered and  they were amicable at the time of discharge.  Their vital signs are stable.  Final Clinical Impression(s) / ED Diagnoses Final diagnoses:  Kidney stone   Rx / DC Orders ED Discharge Orders         Ordered    ondansetron (ZOFRAN) 4 MG tablet  Every 8 hours PRN        01/10/20 1506    ciprofloxacin (CIPRO) 500 MG tablet  2 times daily        01/10/20 1506    tamsulosin (FLOMAX) 0.4 MG CAPS capsule  Daily after breakfast        01/10/20 1506           Placido Sou, PA-C 01/10/20 1540    Tegeler, Canary Brim, MD 01/10/20 1623

## 2020-01-10 NOTE — ED Notes (Signed)
Pt d/c by MD, Pt is provided w/ d.c instructions and follow up care, Pt is out of the ED in wheelchair

## 2020-01-10 NOTE — ED Triage Notes (Signed)
Pt reports R flank pain since Friday, states pain was better over the past few days then returned this morning. Denies difficulty urinating but endorses vomiting. Hx of kidney stones in the past.

## 2020-01-10 NOTE — ED Notes (Signed)
Pt ambulated to bathroom without difficulty.

## 2020-01-10 NOTE — ED Notes (Signed)
Assumed care of patient.

## 2020-01-10 NOTE — Discharge Instructions (Addendum)
Like we discussed, I am prescribing you 3 medications.  The first medication is called ciprofloxacin.  This is a strong antibiotic.  Please take it as prescribed.  Please do not stop taking it early.  I am also prescribing you tamsulosin.  This is also called Flomax.  You can take this in the morning and this will cause increased urinary frequency.  When using your strainer check to see if you can visualize the stone.  You can discontinue Flomax once the stone is passed.  Lastly, I am prescribing a medication called Zofran.  This will help with your nausea and vomiting.  Please only take it if you are experiencing significant nausea and vomiting you cannot control.  If you develop new or worsening symptoms like worsening nausea and vomiting or high fevers, please return to the emergency department for reevaluation.  Otherwise, please follow-up with Dr. Benancio Deeds next week.  He is a urologist and is expecting your call.  It was a pleasure to meet you.

## 2020-01-12 LAB — URINE CULTURE: Culture: NO GROWTH

## 2020-08-19 ENCOUNTER — Telehealth: Payer: Self-pay | Admitting: Nurse Practitioner

## 2020-08-19 NOTE — Telephone Encounter (Signed)
Pt called and stated they have covid and was told to let their provider know and they wanted to know if Shelia Walker can either prescribe something or recommend something over the counter. They said they really cant afford an appointment right now. Please advise

## 2020-08-20 NOTE — Telephone Encounter (Addendum)
Last office visit was 08/2018. Patient needs a visit maybe a virtual visit?  Please call patient and offer a virtual  visit for COVID symptoms.

## 2020-08-20 NOTE — Telephone Encounter (Signed)
[  Per Eliberto Ivory 4:20 PM] Shelia Walker   Shelia Walker  said that they cant afford an appointment right now because they are self pay, They were more wondering if there was something they could take over the counter?

## 2020-08-21 NOTE — Telephone Encounter (Signed)
Pt called back and informed of Nche message.  Pt verbalized understanding.

## 2020-08-21 NOTE — Telephone Encounter (Signed)
Message left for patient to call back to report her COVID symptoms. To see if anything advice can be given.

## 2020-08-21 NOTE — Telephone Encounter (Signed)
2 more calls have been placed for call back. No reply.

## 2020-10-29 ENCOUNTER — Encounter: Payer: Self-pay | Admitting: Nurse Practitioner

## 2021-02-01 IMAGING — CT CT RENAL STONE PROTOCOL
2 of 4 series · 17 of 46 positions shown, 19 images · non-contrast
Comparison: 03/25/2019

CLINICAL DATA: Acute onset of right flank pain several hours ago.
Nephrolithiasis.

EXAM:
CT ABDOMEN AND PELVIS WITHOUT CONTRAST
TECHNIQUE: Multidetector CT imaging of the abdomen and pelvis was performed
following the standard protocol without IV contrast.

[Series 3: renal stone 5.0 · axial · 0.77mm/px · z∈[+647,+1042]mm · 14 of 87 slices shown, 16 images]
[im 4/87  soft-tissue]
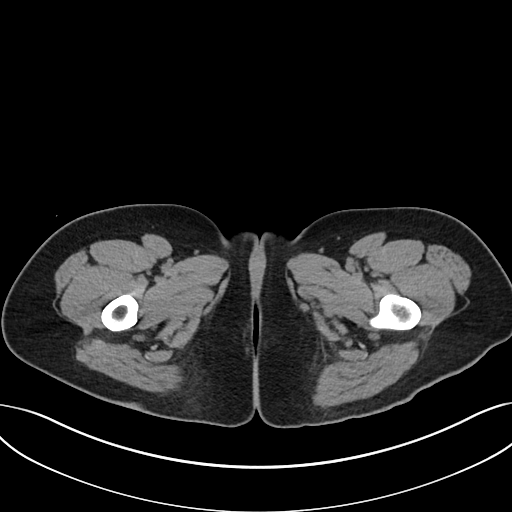
[im 4/87  bone]
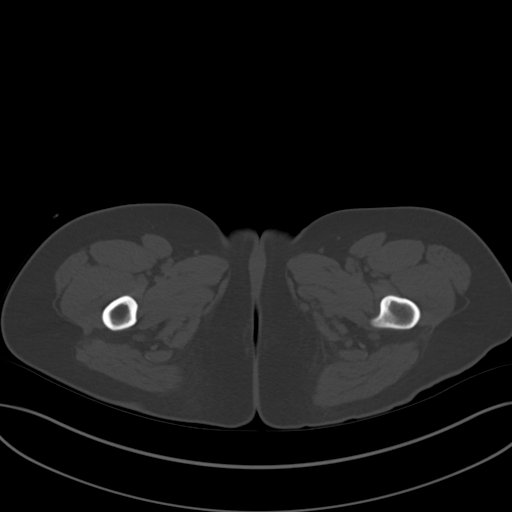
[im 11/87  soft-tissue]
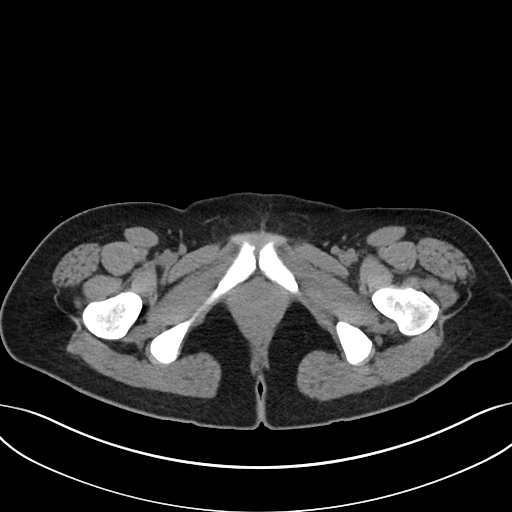
[im 18/87  soft-tissue]
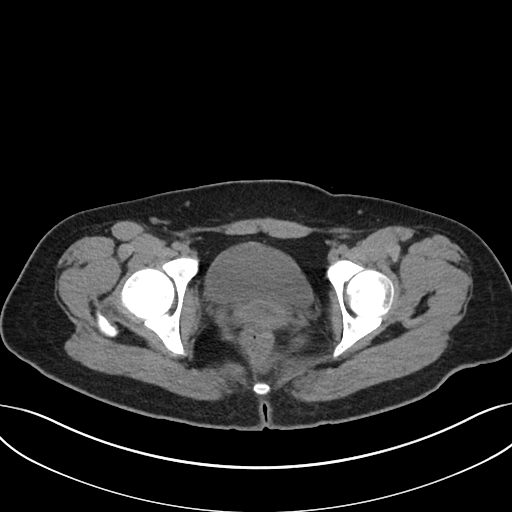
[im 22/87  soft-tissue]
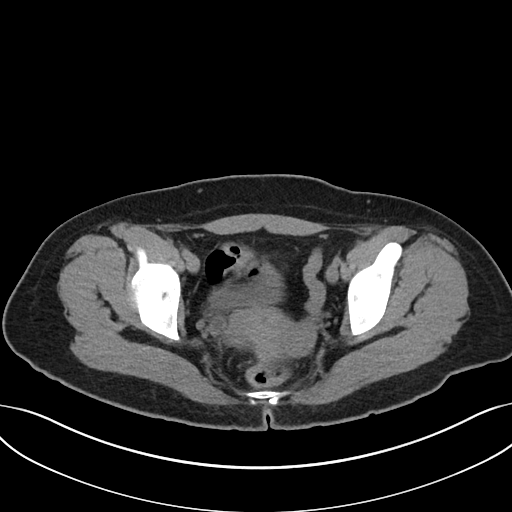
[im 29/87  soft-tissue]
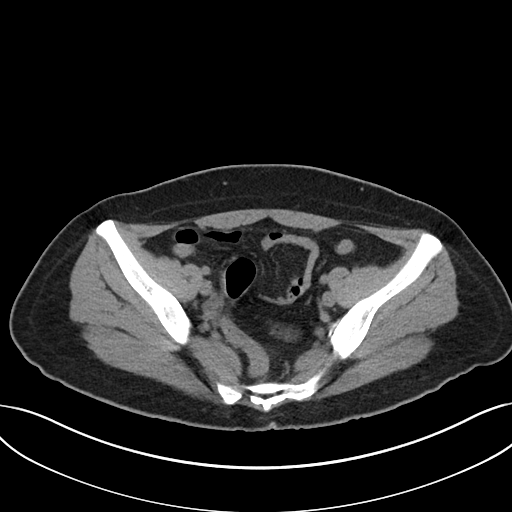
[im 36/87  soft-tissue]
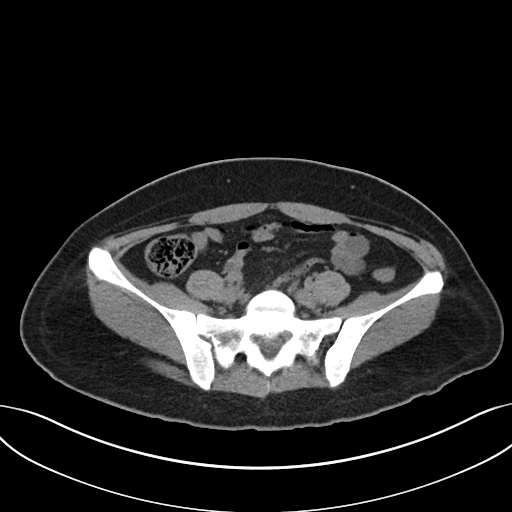
[im 40/87  soft-tissue]
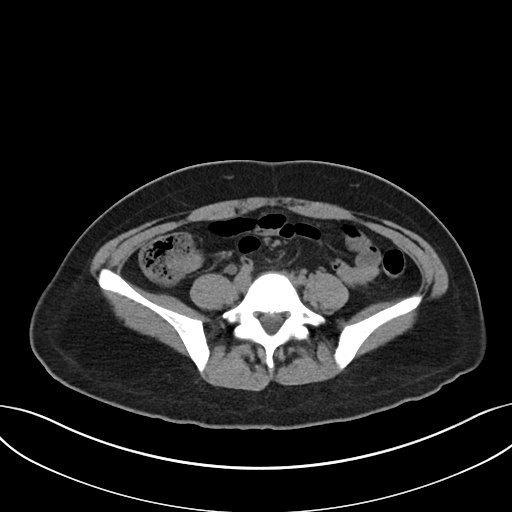
[im 47/87  soft-tissue]
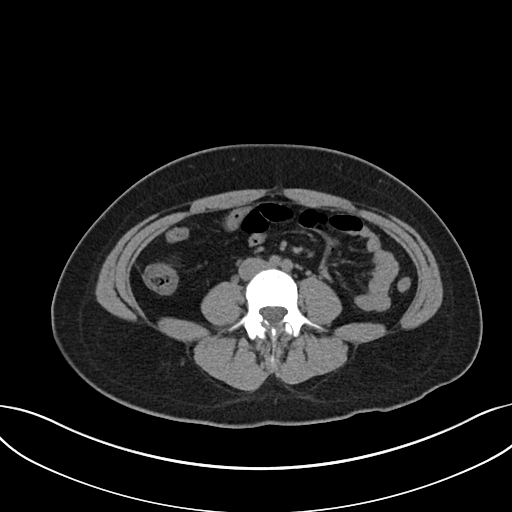
[im 51/87  soft-tissue]
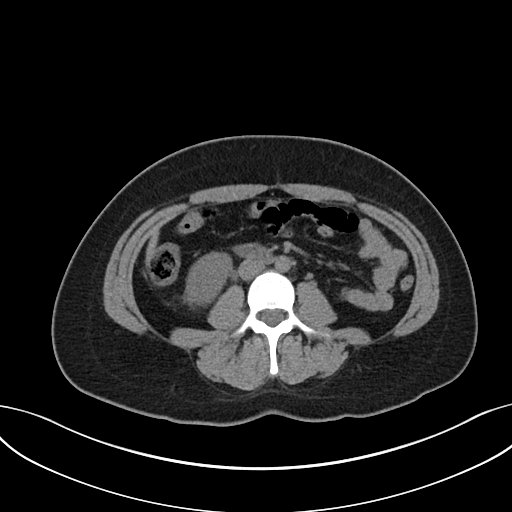
[im 51/87  bone]
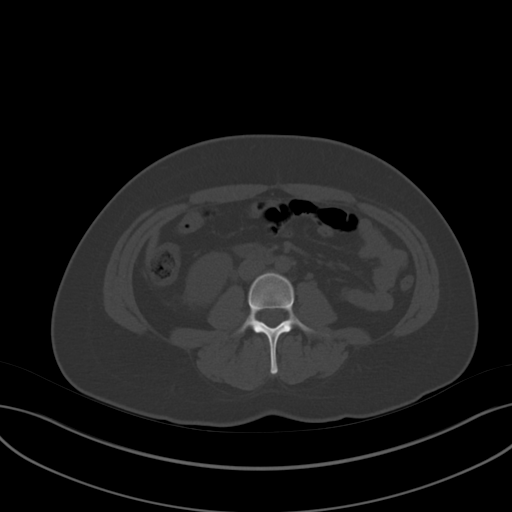
[im 58/87  soft-tissue]
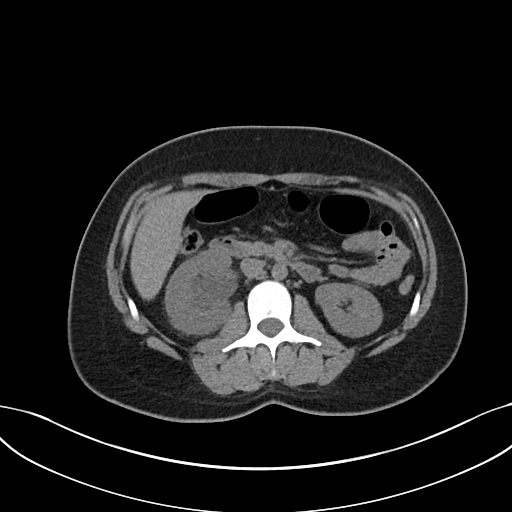
[im 65/87  soft-tissue]
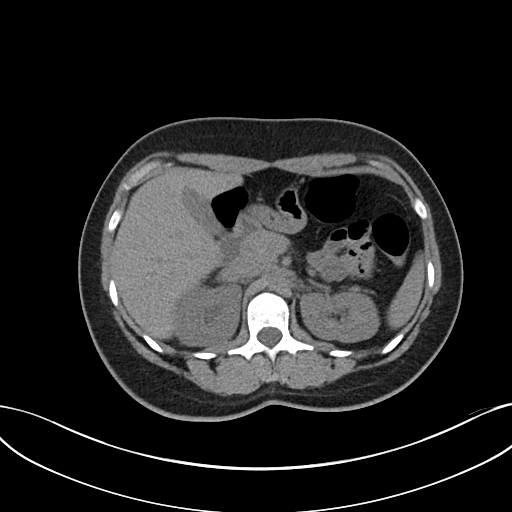
[im 69/87  soft-tissue]
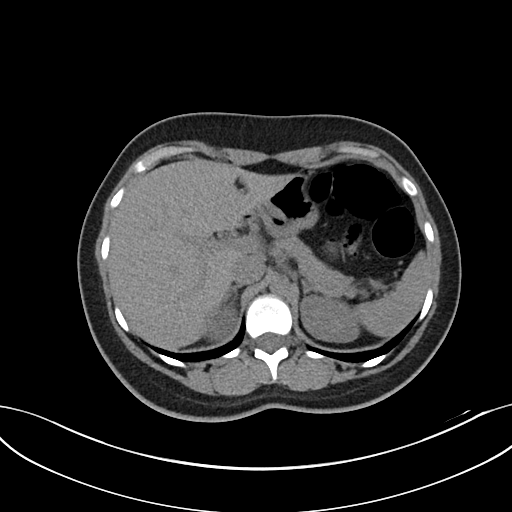
[im 76/87  soft-tissue]
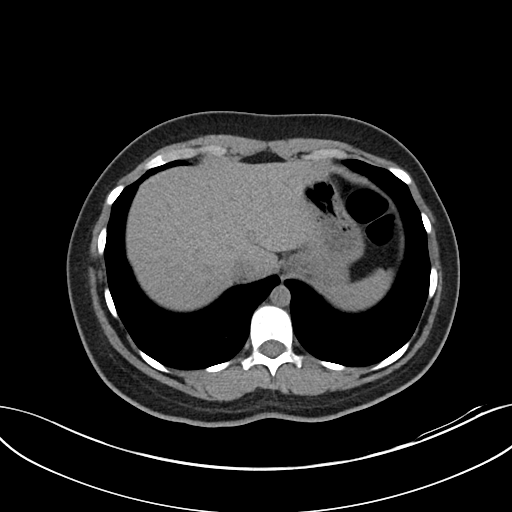
[im 83/87  soft-tissue]
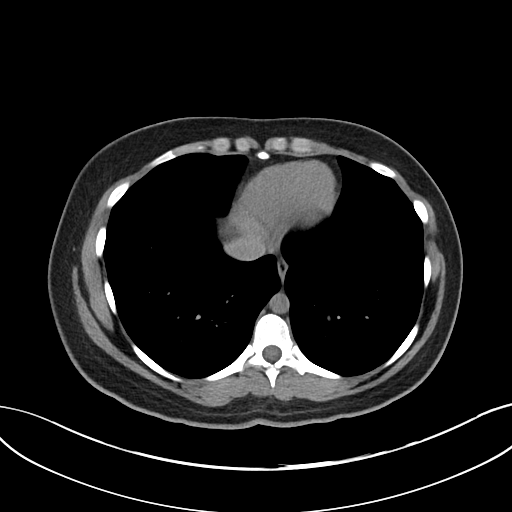

[Series 6: coronal · coronal · 0.80mm/px · 3 of 99 slices shown]
[im 33/99  soft-tissue]
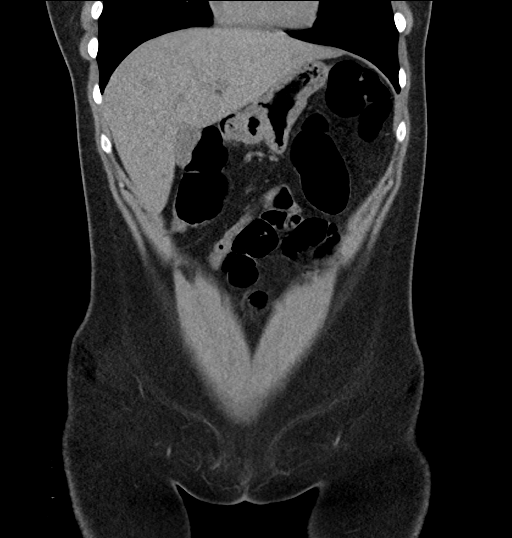
[im 44/99  soft-tissue]
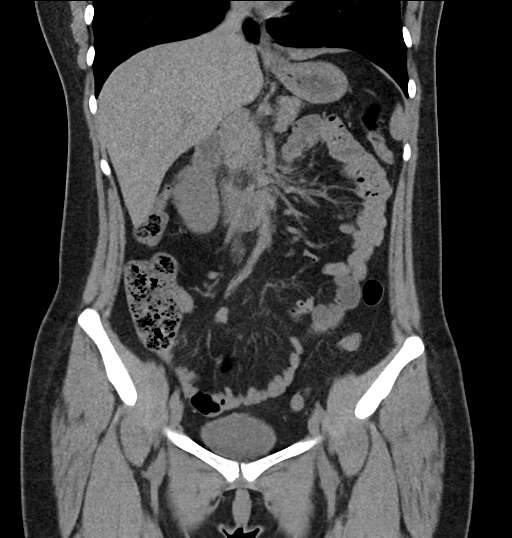
[im 55/99  soft-tissue]
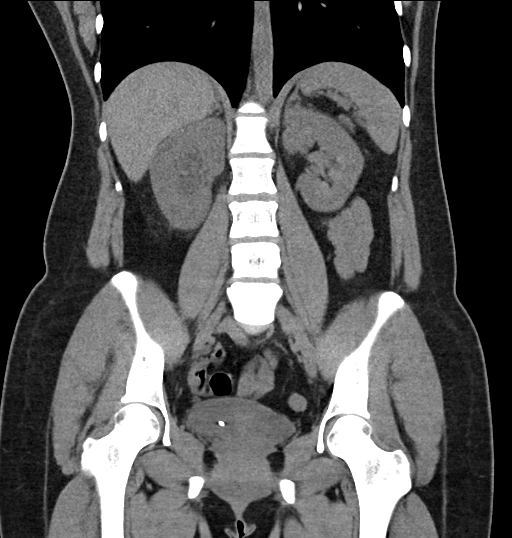

[17 of 46 positions shown; findings below may reference images not displayed]

FINDINGS: Lower chest: No acute findings.

Hepatobiliary: No mass visualized on this unenhanced exam.
Gallbladder is unremarkable. No evidence of biliary ductal
dilatation.

Pancreas: No mass or inflammatory process visualized on this
unenhanced exam.

Spleen:  Within normal limits in size.

Adrenals/Urinary tract: Mild right hydroureteronephrosis and
perinephric stranding are seen. This is due to a calculus at right
UVJ measuring 5 mm.

Stomach/Bowel: No evidence of obstruction, inflammatory process, or
abnormal fluid collections.

Vascular/Lymphatic: No pathologically enlarged lymph nodes
identified. No evidence of abdominal aortic aneurysm.

Reproductive: IUD again seen in expected position. No mass or other
significant abnormality.

Other:  None.

Musculoskeletal:  No suspicious bone lesions identified.
IMPRESSION: Mild right hydroureteronephrosis and perinephric stranding due to 5
mm calculus at right UVJ.

## 2022-08-11 ENCOUNTER — Emergency Department (HOSPITAL_COMMUNITY)
Admission: EM | Admit: 2022-08-11 | Discharge: 2022-08-11 | Disposition: A | Discharge status: Home / Self Care | Payer: Medicaid Other | Attending: Emergency Medicine | Admitting: Emergency Medicine

## 2022-08-11 ENCOUNTER — Other Ambulatory Visit: Payer: Self-pay

## 2022-08-11 ENCOUNTER — Encounter (HOSPITAL_COMMUNITY): Payer: Self-pay | Admitting: *Deleted

## 2022-08-11 DIAGNOSIS — N939 Abnormal uterine and vaginal bleeding, unspecified: Secondary | ICD-10-CM | POA: Diagnosis present

## 2022-08-11 DIAGNOSIS — R102 Pelvic and perineal pain: Secondary | ICD-10-CM | POA: Diagnosis not present

## 2022-08-11 DIAGNOSIS — R42 Dizziness and giddiness: Secondary | ICD-10-CM | POA: Insufficient documentation

## 2022-08-11 DIAGNOSIS — N99820 Postprocedural hemorrhage and hematoma of a genitourinary system organ or structure following a genitourinary system procedure: Secondary | ICD-10-CM | POA: Diagnosis not present

## 2022-08-11 DIAGNOSIS — N99840 Postprocedural hematoma of a genitourinary system organ or structure following a genitourinary system procedure: Secondary | ICD-10-CM

## 2022-08-11 LAB — URINALYSIS, ROUTINE W REFLEX MICROSCOPIC
Bilirubin Urine: NEGATIVE
Glucose, UA: NEGATIVE mg/dL
Ketones, ur: NEGATIVE mg/dL
Leukocytes,Ua: NEGATIVE
Nitrite: NEGATIVE
Protein, ur: NEGATIVE mg/dL
Specific Gravity, Urine: 1.024 (ref 1.005–1.030)
pH: 7 (ref 5.0–8.0)

## 2022-08-11 LAB — COMPREHENSIVE METABOLIC PANEL
ALT: 21 U/L (ref 0–44)
AST: 15 U/L (ref 15–41)
Albumin: 4.2 g/dL (ref 3.5–5.0)
Alkaline Phosphatase: 44 U/L (ref 38–126)
Anion gap: 10 (ref 5–15)
BUN: 21 mg/dL — ABNORMAL HIGH (ref 6–20)
CO2: 23 mmol/L (ref 22–32)
Calcium: 8.8 mg/dL — ABNORMAL LOW (ref 8.9–10.3)
Chloride: 102 mmol/L (ref 98–111)
Creatinine, Ser: 0.68 mg/dL (ref 0.44–1.00)
GFR, Estimated: >60 mL/min (ref >60)
Glucose, Bld: 104 mg/dL — ABNORMAL HIGH (ref 70–99)
Potassium: 3.7 mmol/L (ref 3.5–5.1)
Sodium: 135 mmol/L (ref 135–145)
Total Bilirubin: 0.3 mg/dL (ref 0.3–1.2)
Total Protein: 7.2 g/dL (ref 6.5–8.1)

## 2022-08-11 LAB — CBC
HCT: 39.2 % (ref 36.0–46.0)
Hemoglobin: 12.7 g/dL (ref 12.0–15.0)
MCH: 27.9 pg (ref 26.0–34.0)
MCHC: 32.4 g/dL (ref 30.0–36.0)
MCV: 86.2 fL (ref 80.0–100.0)
Platelets: 372 K/uL (ref 150–400)
RBC: 4.55 MIL/uL (ref 3.87–5.11)
RDW: 13.1 % (ref 11.5–15.5)
WBC: 7.7 K/uL (ref 4.0–10.5)
nRBC: 0 % (ref 0.0–0.2)

## 2022-08-11 LAB — TYPE AND SCREEN
ABO/RH(D): A POS
Antibody Screen: NEGATIVE

## 2022-08-11 LAB — LIPASE, BLOOD: Lipase: 38 U/L (ref 11–51)

## 2022-08-11 NOTE — Discharge Instructions (Signed)
Follow-up with your [facility] tomorrow. Get help right away if: You have a fever and your symptoms suddenly get worse. You have severe abdominal pain. You have chest pain. You have shortness of breath. You faint. You have pain, swelling, or redness in your leg. You have heavy vaginal bleeding and blood clots, soaking through a sanitary pad in less than 1 hour. These symptoms may represent a serious problem that is an emergency. Do not wait to see if the symptoms will go away. Get medical help right away. Call your local emergency services (911 in the [place] Do not drive yourself to the hospital.

## 2022-08-11 NOTE — ED Provider Notes (Signed)
[facility] AT [facility] Provider Note   CSN: [id number] Arrival date & time: 08/11/22  1458     History  Chief Complaint  Patient presents with   Abdominal Pain    Shelia [facility] is a 27 y.o. nonbinary adult who presents with cc of heavy vaginal bleeding. They had laparoscopic total hysterectomy  on 07/21/3032. They had only light spotting until this morning when they noticed heavy large clots from the vagina. They are having some cramping in the pelvis. They ahave associated light headedness.   Abdominal Pain      Home Medications Prior to Admission medications   Medication Sig Start Date End Date Taking? Authorizing Provider  emtricitabine-tenofovir (TRUVADA) 200-300 MG tablet Take 1 tablet by mouth daily.   Yes [provider]  ibuprofen (ADVIL) 600 MG tablet Take 1 tablet (600 mg total) by mouth every 6 (six) hours as needed. 08/18/18  Yes Fayrene Helper, PA-C  B-D 3CC LUER-LOK SYR 25GX1/2" 25G X 1-1/2" 3 ML MISC every 14 (fourteen) days.  10/21/17   [provider]  BD DISP NEEDLES 22G X 1-1/2" MISC as directed.  10/21/17   [provider]  cephALEXin (KEFLEX) 500 MG capsule Take 1 capsule (500 mg total) by mouth 4 (four) times daily. Patient not taking: Reported on 01/10/2020 03/25/19   Lorre Nick, MD  Cranberry (CRANBERRY CONCENTRATE) 500 MG CAPS Take 500 mg by mouth daily.  Patient not taking: Reported on 08/11/2022    [provider]  cyclobenzaprine (FLEXERIL) 10 MG tablet Take 1 tablet (10 mg total) by mouth 2 (two) times daily as needed for muscle spasms. Patient not taking: Reported on 01/10/2020 06/23/19   Roxy Horseman, PA-C  FLUoxetine HCl 60 MG TABS Take 30 mg by mouth daily. Take half tab once a day Patient not taking: Reported on 01/10/2020 07/11/18   Nche, Bonna Gains, NP  gabapentin (NEURONTIN) 100 MG capsule Take 1cap AM & noon and 2caps at hs continous Patient not taking: Reported on  01/10/2020 09/12/18   Nche, Bonna Gains, NP  loperamide (IMODIUM) 2 MG capsule Take 1 capsule (2 mg total) by mouth 4 (four) times daily as needed for diarrhea or loose stools. Patient not taking: Reported on 01/10/2020 06/23/19   Roxy Horseman, PA-C  ondansetron (ZOFRAN) 4 MG tablet Take 1 tablet (4 mg total) by mouth every 8 (eight) hours as needed for nausea or vomiting. Patient not taking: Reported on 08/11/2022 01/10/20   Placido Sou, PA-C  oxyCODONE-acetaminophen (PERCOCET) 7.5-325 MG tablet Take 1 tablet by mouth See admin instructions. Take 1 tablet by mouth three to four times a day as needed for pain Patient not taking: Reported on 08/11/2022 03/23/19   [provider]  tamsulosin (FLOMAX) 0.4 MG CAPS capsule Take 1 capsule (0.4 mg total) by mouth daily after breakfast. Patient not taking: Reported on 08/11/2022 01/10/20   Placido Sou, PA-C  Testosterone Cypionate 200 MG/ML SOLN Inject 100 mg into the muscle every 14 (fourteen) days. Patient not taking: Reported on 08/11/2022    [provider]      Allergies    Ginger, Pumpkin seed oil [germanium], and Pyridium [phenazopyridine hcl]    Review of Systems   Review of Systems  Gastrointestinal:  Positive for abdominal pain.    Physical Exam Updated Vital Signs BP 122/72   Pulse 95   Temp 98 F (36.7 C) (Oral)   Resp 20   Ht 5\' 3"  (1.6 m)   Wt  61.7 kg   LMP  (LMP Unknown)   SpO2 100%   BMI 24.10 kg/m  Physical Exam Vitals and nursing note reviewed.  Constitutional:      General: Shelia Bristow "Wilson Singer" is not in acute distress.    Appearance: Shelia Bissette "Wilson Singer" is well-developed. Shelia Duggin "Wilson Singer" is not diaphoretic.  HENT:     Head: Normocephalic and atraumatic.     Right Ear: External ear normal.     Left Ear: External ear normal.     Nose: Nose normal.     Mouth/Throat:     Mouth: Mucous membranes are moist.  Eyes:     General: No scleral icterus.    Conjunctiva/sclera:  Conjunctivae normal.  Cardiovascular:     Rate and Rhythm: Normal rate and regular rhythm.     Heart sounds: Normal heart sounds. No murmur heard.    No friction rub. No gallop.  Pulmonary:     Effort: Pulmonary effort is normal. No respiratory distress.     Breath sounds: Normal breath sounds.  Abdominal:     General: Bowel sounds are normal. There is no distension.     Palpations: Abdomen is soft. There is no mass.     Tenderness: There is no abdominal tenderness. There is no guarding.  Genitourinary:    Comments: Normal female external in the Tilia Clitoral piercing present Vagina is well estrogenized and and rugated, vaginal walls are pink without discharge.  The vaginal cuff appears to be intact without evidence of dehiscence, small amount of clot is noted and easily wiped away with Fox swab, no active bleeding or clotting noted Musculoskeletal:     Cervical back: Normal range of motion.  Skin:    General: Skin is warm and dry.  Neurological:     Mental Status: Shelia Loch "Wilson Singer" is alert and oriented to person, place, and time.  Psychiatric:        Behavior: Behavior normal.     ED Results / Procedures / Treatments   Labs (all labs ordered are listed, but only abnormal results are displayed) Labs Reviewed  COMPREHENSIVE METABOLIC PANEL - Abnormal; Notable for the following components:      Result Value   Glucose, Bld 104 (*)    BUN 21 (*)    Calcium 8.8 (*)    All other components within normal limits  URINALYSIS, ROUTINE W REFLEX MICROSCOPIC - Abnormal; Notable for the following components:   APPearance CLOUDY (*)    Hgb urine dipstick MODERATE (*)    Bacteria, UA RARE (*)    All other components within normal limits  LIPASE, BLOOD  CBC  TYPE AND SCREEN    EKG None  Radiology No results found.  Procedures Procedures    Medications Ordered in ED Medications - No data to display  ED Course/ Medical Decision Making/ A&P                              Medical Decision Making   27 year old individual here with complaint of passing a clot from the vagina after total vaginal hysterectomy on 07/22/2022. I ordered and reviewed labs which show no significant abnormalities, hemoglobin within normal limits. Vital signs are stable, patient normotensive without tachycardia. Patient's physical exam is reassuring without evidence of dehiscence of the cuff.  No evidence of infection.  Case discussed with Dr. Ian Malkin via PAL line with [facility].  In shared decision-making he feels there is no  further workup needed based on my information.  Patient is comfortable with plan to be discharged and follow closely with her surgeon.  She may call the office tomorrow morning to set up close follow-up.       Final Clinical Impression(s) / ED Diagnoses Final diagnoses:  None    Rx / DC Orders ED Discharge Orders     None         Arthor Captain, PA-C 08/11/22 1944    Terrilee Files, MD 08/12/22 [phone]

## 2022-08-11 NOTE — ED Triage Notes (Signed)
The pt had a hysterectomy 3 weeks ago in [place]  she has mild vaginal bleeding since the procedure but today she started having blood clots  she called her surgeon and was tole to come to the  ed  she Is also c/o abd pain and weakness
